# Patient Record
Sex: Male | Born: 1975 | Race: White | Hispanic: No | Marital: Married | Smoking: Never smoker
Health system: Southern US, Community
[De-identification: ages and names within clinical notes are randomized; demographics above are authoritative.]

## PROBLEM LIST (undated history)

## (undated) DIAGNOSIS — G4730 Sleep apnea, unspecified: Secondary | ICD-10-CM

## (undated) DIAGNOSIS — F319 Bipolar disorder, unspecified: Secondary | ICD-10-CM

## (undated) DIAGNOSIS — F329 Major depressive disorder, single episode, unspecified: Secondary | ICD-10-CM

## (undated) DIAGNOSIS — I10 Essential (primary) hypertension: Secondary | ICD-10-CM

## (undated) DIAGNOSIS — F32A Depression, unspecified: Secondary | ICD-10-CM

## (undated) DIAGNOSIS — F419 Anxiety disorder, unspecified: Secondary | ICD-10-CM

## (undated) HISTORY — PX: GASTRIC BYPASS: SHX52

## (undated) HISTORY — PX: CHOLECYSTECTOMY: SHX55

## (undated) HISTORY — PX: KNEE SURGERY: SHX244

## (undated) HISTORY — PX: CORONARY ANGIOPLASTY WITH STENT PLACEMENT: SHX49

## (undated) HISTORY — PX: NOSE SURGERY: SHX723

---

## 2007-12-11 ENCOUNTER — Ambulatory Visit: Payer: Self-pay | Admitting: Family Medicine

## 2007-12-11 LAB — CONVERTED CEMR LAB: Inflenza A Ag: NEGATIVE

## 2008-03-23 ENCOUNTER — Ambulatory Visit: Payer: Self-pay | Admitting: Occupational Medicine

## 2008-03-23 DIAGNOSIS — T148XXA Other injury of unspecified body region, initial encounter: Secondary | ICD-10-CM | POA: Insufficient documentation

## 2008-03-23 DIAGNOSIS — I10 Essential (primary) hypertension: Secondary | ICD-10-CM | POA: Insufficient documentation

## 2012-05-18 ENCOUNTER — Encounter (HOSPITAL_BASED_OUTPATIENT_CLINIC_OR_DEPARTMENT_OTHER): Payer: Self-pay | Admitting: *Deleted

## 2012-05-18 ENCOUNTER — Emergency Department (HOSPITAL_BASED_OUTPATIENT_CLINIC_OR_DEPARTMENT_OTHER): Payer: BC Managed Care – PPO

## 2012-05-18 ENCOUNTER — Emergency Department (HOSPITAL_BASED_OUTPATIENT_CLINIC_OR_DEPARTMENT_OTHER)
Admission: EM | Admit: 2012-05-18 | Discharge: 2012-05-19 | Disposition: A | Discharge status: Home / Self Care | Payer: BC Managed Care – PPO | Attending: Emergency Medicine | Admitting: Emergency Medicine

## 2012-05-18 DIAGNOSIS — I10 Essential (primary) hypertension: Secondary | ICD-10-CM | POA: Insufficient documentation

## 2012-05-18 DIAGNOSIS — Z79899 Other long term (current) drug therapy: Secondary | ICD-10-CM | POA: Insufficient documentation

## 2012-05-18 DIAGNOSIS — Z9884 Bariatric surgery status: Secondary | ICD-10-CM | POA: Insufficient documentation

## 2012-05-18 DIAGNOSIS — R109 Unspecified abdominal pain: Secondary | ICD-10-CM

## 2012-05-18 DIAGNOSIS — R110 Nausea: Secondary | ICD-10-CM | POA: Insufficient documentation

## 2012-05-18 DIAGNOSIS — F319 Bipolar disorder, unspecified: Secondary | ICD-10-CM | POA: Insufficient documentation

## 2012-05-18 DIAGNOSIS — F411 Generalized anxiety disorder: Secondary | ICD-10-CM | POA: Insufficient documentation

## 2012-05-18 DIAGNOSIS — R1032 Left lower quadrant pain: Secondary | ICD-10-CM | POA: Insufficient documentation

## 2012-05-18 DIAGNOSIS — R300 Dysuria: Secondary | ICD-10-CM | POA: Insufficient documentation

## 2012-05-18 HISTORY — DX: Bipolar disorder, unspecified: F31.9

## 2012-05-18 HISTORY — DX: Depression, unspecified: F32.A

## 2012-05-18 HISTORY — DX: Sleep apnea, unspecified: G47.30

## 2012-05-18 HISTORY — DX: Anxiety disorder, unspecified: F41.9

## 2012-05-18 HISTORY — DX: Essential (primary) hypertension: I10

## 2012-05-18 HISTORY — DX: Major depressive disorder, single episode, unspecified: F32.9

## 2012-05-18 LAB — COMPREHENSIVE METABOLIC PANEL
BUN: 12 mg/dL (ref 6–23)
CO2: 27 mEq/L (ref 19–32)
Calcium: 9.4 mg/dL (ref 8.4–10.5)
Creatinine, Ser: 0.9 mg/dL (ref 0.50–1.35)
GFR calc Af Amer: >90 mL/min (ref >90)
GFR calc non Af Amer: >90 mL/min (ref >90)
Glucose, Bld: 126 mg/dL — ABNORMAL HIGH (ref 70–99)

## 2012-05-18 LAB — CBC WITH DIFFERENTIAL/PLATELET
Eosinophils Absolute: 0.3 10*3/uL (ref 0.0–0.7)
Hemoglobin: 15.3 g/dL (ref 13.0–17.0)
MCH: 29.4 pg (ref 26.0–34.0)
MCHC: 33.6 g/dL (ref 30.0–36.0)
Neutro Abs: 9.4 10*3/uL — ABNORMAL HIGH (ref 1.7–7.7)
Neutrophils Relative %: 80 % — ABNORMAL HIGH (ref 43–77)
Platelets: 254 10*3/uL (ref 150–400)

## 2012-05-18 MED ORDER — IOHEXOL 300 MG/ML  SOLN
125.0000 mL | Freq: Once | INTRAMUSCULAR | Status: AC | PRN
  Administered 2012-05-18: 125 mL via INTRAVENOUS

## 2012-05-18 MED ORDER — HYDROMORPHONE HCL PF 1 MG/ML IJ SOLN
1.0000 mg | Freq: Once | INTRAMUSCULAR | Status: AC
  Administered 2012-05-18: 1 mg via INTRAVENOUS
  Filled 2012-05-18: qty 1

## 2012-05-18 MED ORDER — IOHEXOL 300 MG/ML  SOLN
50.0000 mL | Freq: Once | INTRAMUSCULAR | Status: AC | PRN
  Administered 2012-05-18: 50 mL via ORAL

## 2012-05-18 MED ORDER — SODIUM CHLORIDE 0.9 % IV SOLN: Freq: Once | INTRAVENOUS | Status: DC

## 2012-05-18 MED ORDER — ONDANSETRON HCL 4 MG/2ML IJ SOLN
4.0000 mg | Freq: Once | INTRAMUSCULAR | Status: AC
  Administered 2012-05-18: 4 mg via INTRAVENOUS
  Filled 2012-05-18: qty 2

## 2012-05-18 NOTE — ED Notes (Signed)
Pt has hx of Gastric Bypass 12 yrs ago. Presents today with abd distention, dysuria. +nausea. No V/D

## 2012-05-18 NOTE — ED Provider Notes (Signed)
History  This chart was scribed for Jim Booze, MD by Bennett Scrape, ED Scribe. This patient was seen in room MH12/MH12 and the patient's care was started at 7:49 PM.  CSN: [id number]  Arrival date & time 05/18/12  1906   First MD Initiated Contact with Patient 05/18/12 1949      Chief Complaint  Patient presents with  . Abdominal Pain     The history is provided by the patient. No language interpreter was used.    Jim Pearson is a 37 y.o. male with a h/o gastric bypass surgery 12 years ago who presents to the Emergency Department complaining of 9 hours of intermittent episodes of LLQ abdominal pain that radiates to his back with associated nausea, abdominal distention and dysuria. The episodes last approximately 30 minutes long and touch aggravates the pain. He rates his pain a 10 out of 10 at its worse and a 5 out of 10 currently. He states that he has still had flatulence and the pain occasionally feels improved afterwards. He states that he tried milk of magnesia for his symptoms with no improvement. He denies fevers, chills, constipation and diarrhea as associated symptoms. He denies having difficulty vomiting from the bypass surgery. He also has a h/o HTN, bipolar disorder and anxiety. He denies smoking and alcohol use.  PCP is Dr. Verdene Rio  Past Medical History  Diagnosis Date  . Sleep apnea   . Hypertension   . Bipolar 1 disorder   . Depression   . Anxiety     Past Surgical History  Procedure Laterality Date  . Gastric bypass    . Cholecystectomy    . Knee surgery    . Nose surgery      History reviewed. No pertinent family history.  History  Substance Use Topics  . Smoking status: Never Smoker   . Smokeless tobacco: Not on file  . Alcohol Use: No      Review of Systems  Constitutional: Negative for fever and chills.  Gastrointestinal: Positive for nausea, abdominal pain and abdominal distention. Negative for vomiting, diarrhea and constipation.   Genitourinary: Positive for dysuria. Negative for hematuria.  All other systems reviewed and are negative.    Allergies  Review of patient's allergies indicates no known allergies.  Home Medications   Current Outpatient Rx  Name  Route  Sig  Dispense  Refill  . clonazePAM (KLONOPIN) 2 MG tablet   Oral   Take 2 mg by mouth 3 (three) times daily as needed for anxiety.         Marland Kitchen lithium 600 MG capsule   Oral   Take 600 mg by mouth 2 (two) times daily with a meal.         . PRAZOSIN HCL PO   Oral   Take 4 mg by mouth at bedtime.         Marland Kitchen QUEtiapine (SEROQUEL) 400 MG tablet   Oral   Take 800 mg by mouth at bedtime.         Marland Kitchen venlafaxine (EFFEXOR) 100 MG tablet   Oral   Take 100 mg by mouth 3 (three) times daily.         Marland Kitchen zolpidem (AMBIEN) 10 MG tablet   Oral   Take 10 mg by mouth at bedtime as needed for sleep.           Triage Vitals: BP 160/102  Pulse 90  Temp(Src) 98.1 F (36.7 C) (Oral)  Resp 20  Ht  5\' 11"  (1.803 m)  Wt 280 lb (127.007 kg)  BMI 39.07 kg/m2  SpO2 95%  Physical Exam  Nursing note and vitals reviewed. Constitutional: He is oriented to person, place, and time. He appears well-developed and well-nourished. No distress.  Obese   HENT:  Head: Normocephalic and atraumatic.  Eyes: Conjunctivae and EOM are normal.  Neck: Neck supple. No tracheal deviation present.  Cardiovascular: Normal rate and regular rhythm.   Pulmonary/Chest: Effort normal and breath sounds normal. No respiratory distress.  Abdominal: Soft. There is tenderness (diffuse tenderness, worse in RUQ ). There is no rebound and no guarding.  Mildly distended, bowel sounds are decreased   Musculoskeletal: Normal range of motion. He exhibits no edema.  Neurological: He is alert and oriented to person, place, and time.  Skin: Skin is warm and dry.  Psychiatric: He has a normal mood and affect. His behavior is normal.    ED Course  Procedures (including critical care  time)  Medications  0.9 %  sodium chloride infusion (not administered)  HYDROmorphone (DILAUDID) injection 1 mg (not administered)  ondansetron (ZOFRAN) injection 4 mg (not administered)    DIAGNOSTIC STUDIES: Oxygen Saturation is 95% on RA, normal by my interpretation.    COORDINATION OF CARE: 8:13 PM-Discussed treatment plan which includes medications, CT of abdomen, CBC panel, CMP, UA with pt at bedside and pt agreed to plan.   Results for orders placed during the hospital encounter of 05/18/12  CBC WITH DIFFERENTIAL      Result Value Range   WBC 11.8 (*) 4.0 - 10.5 K/uL   RBC 5.20  4.22 - 5.81 MIL/uL   Hemoglobin 15.3  13.0 - 17.0 g/dL   HCT 96.0  45.4 - 09.8 %   MCV 87.5  78.0 - 100.0 fL   MCH 29.4  26.0 - 34.0 pg   MCHC 33.6  30.0 - 36.0 g/dL   RDW 11.9  14.7 - 82.9 %   Platelets 254  150 - 400 K/uL   Neutrophils Relative 80 (*) 43 - 77 %   Neutro Abs 9.4 (*) 1.7 - 7.7 K/uL   Lymphocytes Relative 8 (*) 12 - 46 %   Lymphs Abs 1.0  0.7 - 4.0 K/uL   Monocytes Relative 10  3 - 12 %   Monocytes Absolute 1.1 (*) 0.1 - 1.0 K/uL   Eosinophils Relative 2  0 - 5 %   Eosinophils Absolute 0.3  0.0 - 0.7 K/uL   Basophils Relative 0  0 - 1 %   Basophils Absolute 0.0  0.0 - 0.1 K/uL  COMPREHENSIVE METABOLIC PANEL      Result Value Range   Sodium 139  135 - 145 mEq/L   Potassium 3.7  3.5 - 5.1 mEq/L   Chloride 101  96 - 112 mEq/L   CO2 27  19 - 32 mEq/L   Glucose, Bld 126 (*) 70 - 99 mg/dL   BUN 12  6 - 23 mg/dL   Creatinine, Ser 5.62  0.50 - 1.35 mg/dL   Calcium 9.4  8.4 - 13.0 mg/dL   Total Protein 7.1  6.0 - 8.3 g/dL   Albumin 4.2  3.5 - 5.2 g/dL   AST 26  0 - 37 U/L   ALT 40  0 - 53 U/L   Alkaline Phosphatase 59  39 - 117 U/L   Total Bilirubin 0.5  0.3 - 1.2 mg/dL   GFR calc non Af Amer >90  >90 mL/min   GFR  calc Af Amer >90  >90 mL/min  LIPASE, BLOOD      Result Value Range   Lipase 54  11 - 59 U/L   Ct Abdomen Pelvis W Contrast  05/18/2012  *RADIOLOGY REPORT*   Clinical Data: Diffuse abdominal pain and bloating, nausea, constipation, increased gas, past history cholecystectomy gastric bypass surgery in 2002  CT ABDOMEN AND PELVIS WITH CONTRAST  Technique:  Multidetector CT imaging of the abdomen and pelvis was performed following the standard protocol during bolus administration of intravenous contrast. Sagittal and coronal MPR images reconstructed from axial data set.  Contrast: 50mL OMNIPAQUE IOHEXOL 300 MG/ML  SOLN, OMNIPAQUE IOHEXOL 300 MG/ML  SOLN  Comparison: None  Findings: Lung bases clear. Liver, spleen, pancreas, kidneys, and adrenal glands normal appearance. Normal appendix, bladder, and ureters. Prior gastric bypass surgery. Gaseous distention of colon with tapering normal caliber at distal sigmoid colon as it transits posteriorly in the pelvis. No definite evidence of bowel obstruction or bowel wall thickening.  No free intraperitoneal air. Supraumbilical and periumbilical hernias containing fat. No mass, adenopathy, free fluid inflammatory process. Dilated left inguinal canal containing fat. No acute osseous findings.  IMPRESSION: Diffuse gaseous distention of colon without evidence of obstruction or wall thickening. Ventral and left inguinal hernias containing fat. Prior gastric bypass surgery. No other intra abdominal or intrapelvic abnormalities identified.   Original Report Authenticated By: Ulyses Southward, M.D.     Images viewed by me.  1. Abdominal pain       MDM  Abdominal pain of uncertain cause. Tenderness is slightly worse on the right side of the abdomen. CT is ordered to rule out appendicitis or other serious pathology.  CT shows gaseous distention of the large colon another to a critical extent. WBC is mildly elevated. Patient states that he feels marginally better. On exam, her abdomen continues to be benign. It is soft with only mild tenderness and no rebound or guarding. Patient is reassured of the results of CT scan. He is  asked whether he should take a laxative but I suggested that perhaps a fleets enema would be more effective to get the has started to pass. He is advised to return should symptoms worsen.  I personally performed the services described in this documentation, which was scribed in my presence. The recorded information has been reviewed and is accurate.         Jim Booze, MD 05/19/12 [phone]

## 2012-05-19 NOTE — ED Notes (Signed)
Urine specimen never obtained EDP aware no new  Orders obtained

## 2014-01-13 IMAGING — CT CT ABD-PELV W/ CM
2 of 4 series · 17 of 46 positions shown, 19 images · IV contrast (APPLIED)
Comparison: None

CLINICAL DATA: Diffuse abdominal pain and bloating, nausea,
constipation, increased gas, past history cholecystectomy gastric
bypass surgery in 3443

CT ABDOMEN AND PELVIS WITH CONTRAST
TECHNIQUE: Multidetector CT imaging of the abdomen and pelvis was
performed following the standard protocol during bolus
administration of intravenous contrast. Sagittal and coronal MPR
images reconstructed from axial data set.
Contrast: 50mL OMNIPAQUE IOHEXOL 300 MG/ML  SOLN, 125mL OMNIPAQUE
IOHEXOL 300 MG/ML  SOLN

[Series 2: abd/pelvis 5.0 b31f · axial · 0.97mm/px · z∈[-544,-54]mm · 14 of 108 slices shown, 16 images]
[im 5/108  soft-tissue]
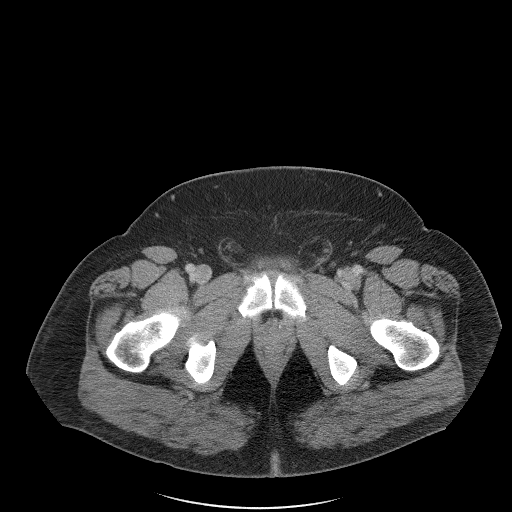
[im 5/108  bone]
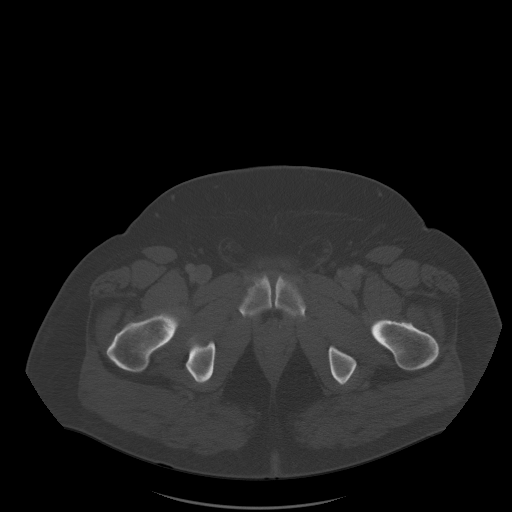
[im 14/108  soft-tissue]
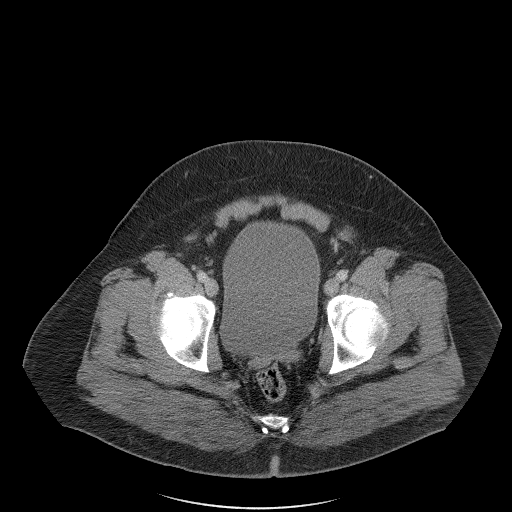
[im 23/108  soft-tissue]
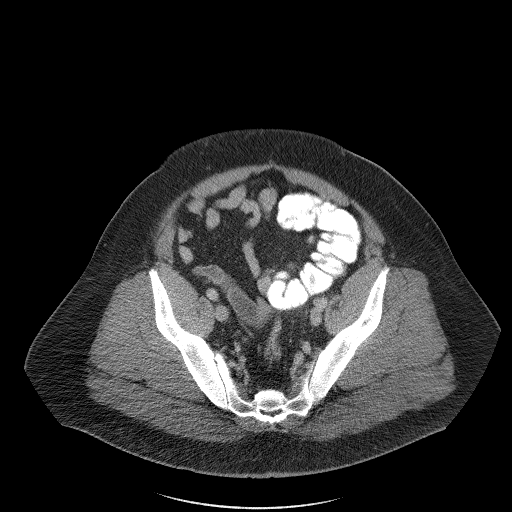
[im 27/108  soft-tissue]
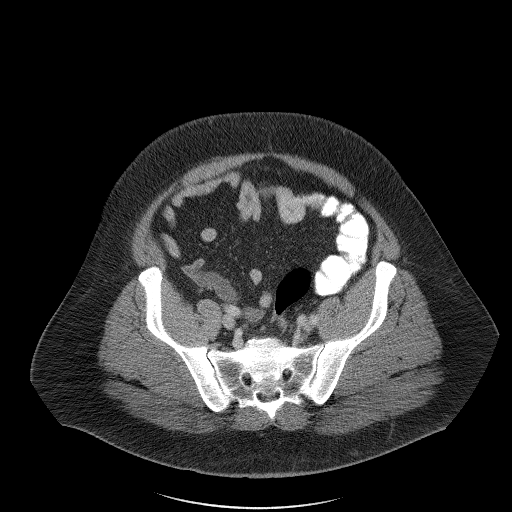
[im 36/108  soft-tissue]
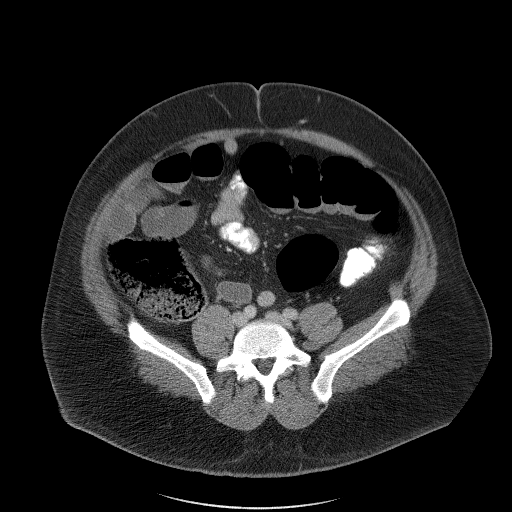
[im 45/108  soft-tissue]
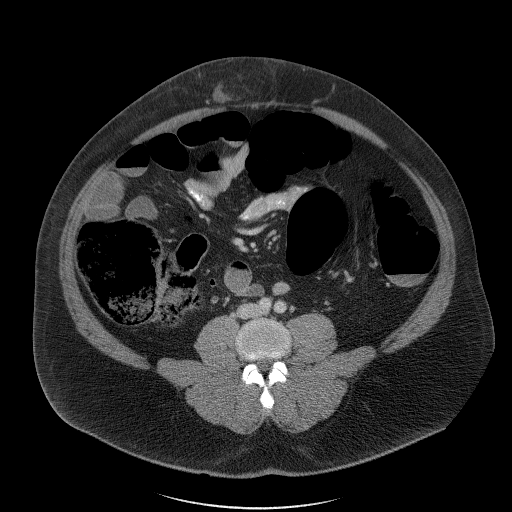
[im 50/108  soft-tissue]
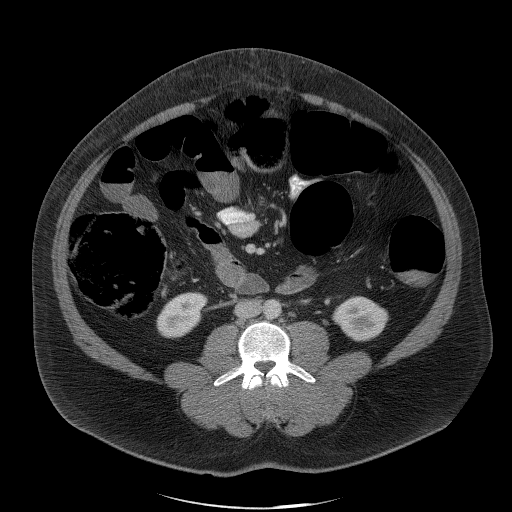
[im 58/108  soft-tissue]
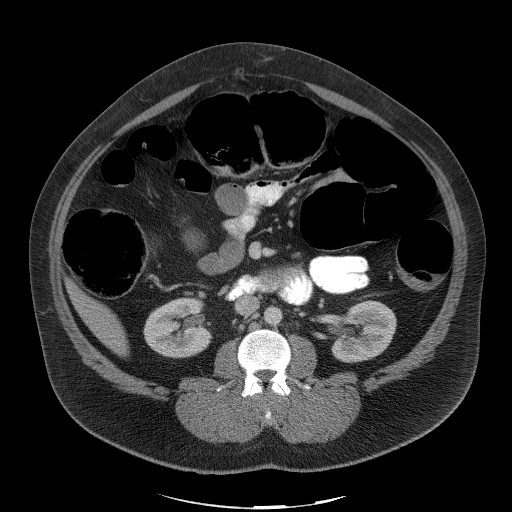
[im 63/108  soft-tissue]
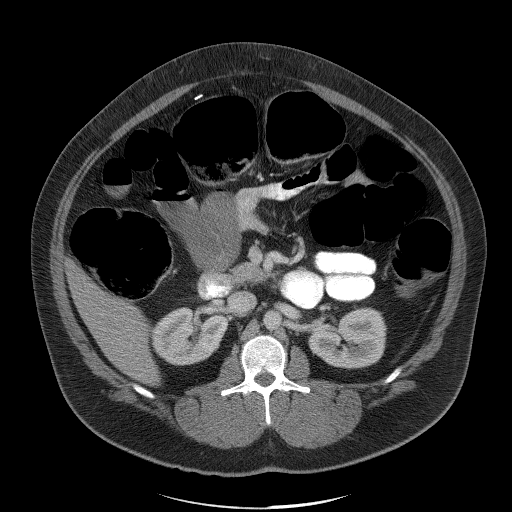
[im 63/108  bone]
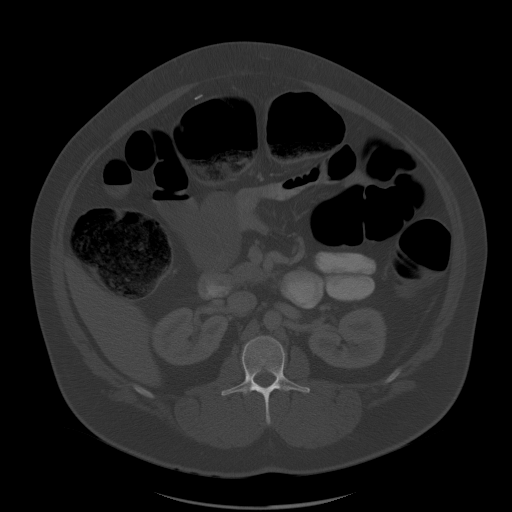
[im 72/108  soft-tissue]
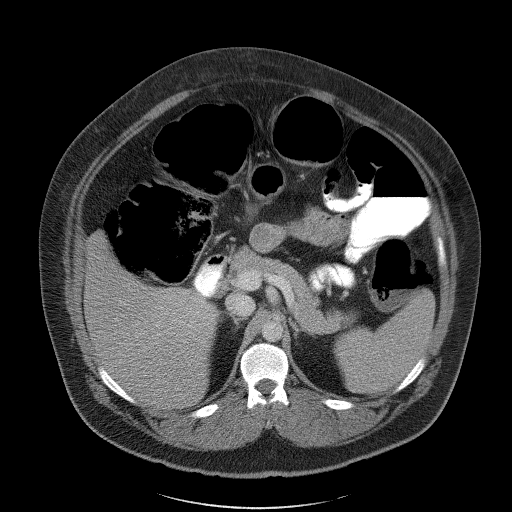
[im 81/108  soft-tissue]
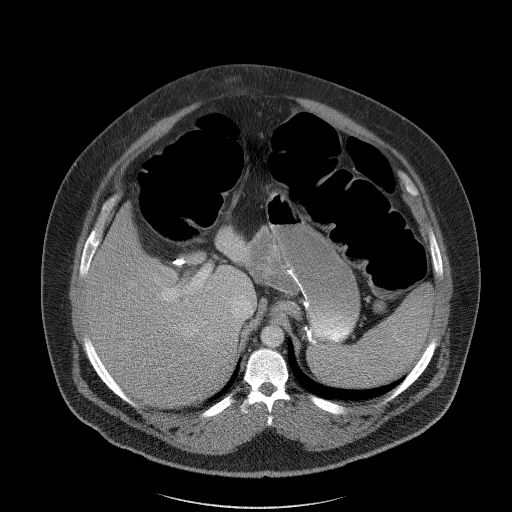
[im 85/108  soft-tissue]
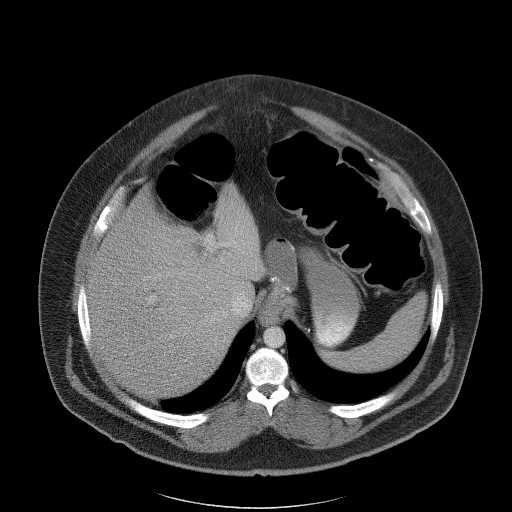
[im 94/108  soft-tissue]
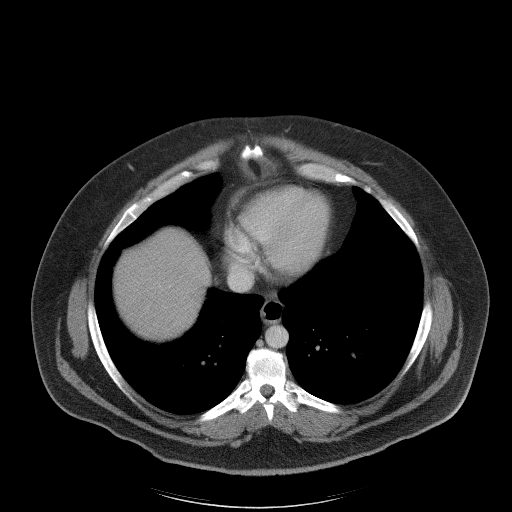
[im 103/108  soft-tissue]
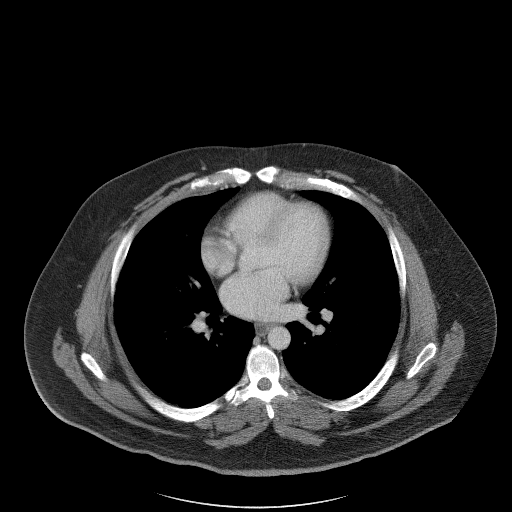

[Series 5: abd/pelvis 3.0 coronal · coronal · 1.00mm/px · 3 of 108 slices shown]
[im 36/108  soft-tissue]
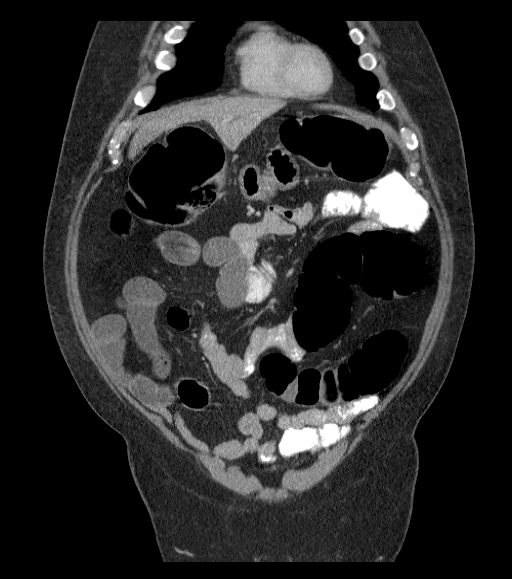
[im 48/108  soft-tissue]
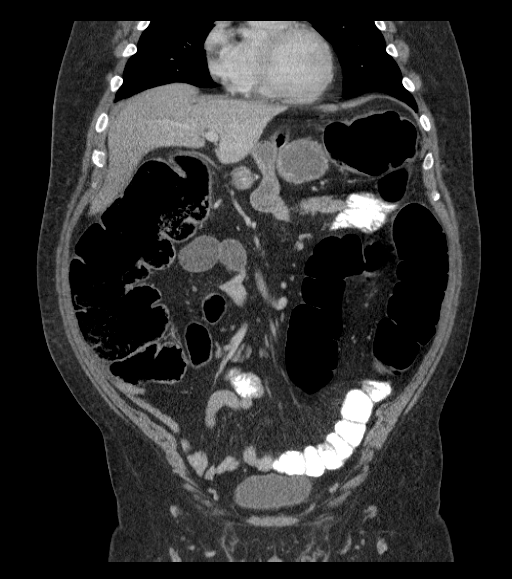
[im 60/108  soft-tissue]
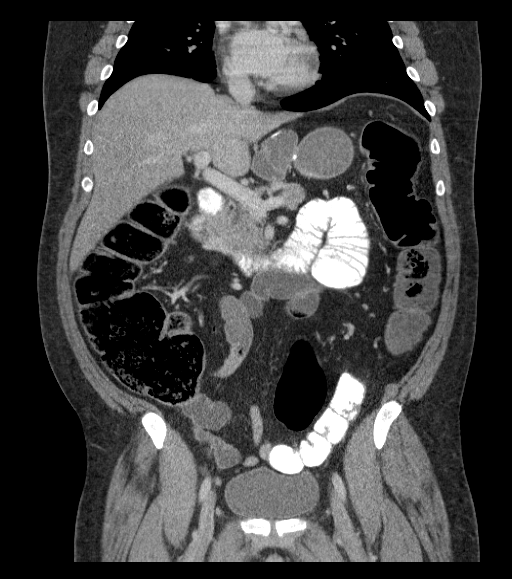

[17 of 46 positions shown; findings below may reference images not displayed]

FINDINGS: Lung bases clear.
Liver, spleen, pancreas, kidneys, and adrenal glands normal
appearance.
Normal appendix, bladder, and ureters. Prior gastric bypass
surgery.
Gaseous distention of colon with tapering normal caliber at distal
sigmoid colon as it transits posteriorly in the pelvis.
No definite evidence of bowel obstruction or bowel wall thickening.

No free intraperitoneal air.
Supraumbilical and periumbilical hernias containing fat.
No mass, adenopathy, free fluid inflammatory process.
Dilated left inguinal canal containing fat.
No acute osseous findings.
IMPRESSION: Diffuse gaseous distention of colon without evidence of obstruction
or wall thickening.
Ventral and left inguinal hernias containing fat.
Prior gastric bypass surgery.
No other intra abdominal or intrapelvic abnormalities identified.

## 2017-05-05 ENCOUNTER — Other Ambulatory Visit: Payer: Self-pay

## 2017-05-05 ENCOUNTER — Encounter (HOSPITAL_BASED_OUTPATIENT_CLINIC_OR_DEPARTMENT_OTHER): Payer: Self-pay | Admitting: *Deleted

## 2017-05-05 ENCOUNTER — Emergency Department (HOSPITAL_BASED_OUTPATIENT_CLINIC_OR_DEPARTMENT_OTHER): Payer: BLUE CROSS/BLUE SHIELD

## 2017-05-05 DIAGNOSIS — I10 Essential (primary) hypertension: Secondary | ICD-10-CM | POA: Insufficient documentation

## 2017-05-05 DIAGNOSIS — Z9884 Bariatric surgery status: Secondary | ICD-10-CM | POA: Insufficient documentation

## 2017-05-05 DIAGNOSIS — Y929 Unspecified place or not applicable: Secondary | ICD-10-CM | POA: Diagnosis not present

## 2017-05-05 DIAGNOSIS — Z9049 Acquired absence of other specified parts of digestive tract: Secondary | ICD-10-CM | POA: Diagnosis not present

## 2017-05-05 DIAGNOSIS — F419 Anxiety disorder, unspecified: Secondary | ICD-10-CM | POA: Insufficient documentation

## 2017-05-05 DIAGNOSIS — W269XXA Contact with unspecified sharp object(s), initial encounter: Secondary | ICD-10-CM | POA: Insufficient documentation

## 2017-05-05 DIAGNOSIS — Y9389 Activity, other specified: Secondary | ICD-10-CM | POA: Insufficient documentation

## 2017-05-05 DIAGNOSIS — F319 Bipolar disorder, unspecified: Secondary | ICD-10-CM | POA: Diagnosis not present

## 2017-05-05 DIAGNOSIS — Y998 Other external cause status: Secondary | ICD-10-CM | POA: Insufficient documentation

## 2017-05-05 DIAGNOSIS — S61314A Laceration without foreign body of right ring finger with damage to nail, initial encounter: Secondary | ICD-10-CM | POA: Insufficient documentation

## 2017-05-05 DIAGNOSIS — Z23 Encounter for immunization: Secondary | ICD-10-CM | POA: Insufficient documentation

## 2017-05-05 NOTE — ED Triage Notes (Signed)
Pt reports he was "collapsing" his gas grill and his right finger was cut through nail bed. Pt has bandage in place from home with no bleeding noted

## 2017-05-06 ENCOUNTER — Emergency Department (HOSPITAL_BASED_OUTPATIENT_CLINIC_OR_DEPARTMENT_OTHER)
Admission: EM | Admit: 2017-05-06 | Discharge: 2017-05-06 | Disposition: A | Discharge status: Home / Self Care | Payer: BLUE CROSS/BLUE SHIELD | Attending: Emergency Medicine | Admitting: Emergency Medicine

## 2017-05-06 DIAGNOSIS — S6991XA Unspecified injury of right wrist, hand and finger(s), initial encounter: Secondary | ICD-10-CM

## 2017-05-06 MED ORDER — CEPHALEXIN 500 MG PO CAPS: 500.0000 mg | ORAL_CAPSULE | Freq: Four times a day (QID) | ORAL | 0 refills | Status: DC

## 2017-05-06 MED ORDER — TETANUS-DIPHTH-ACELL PERTUSSIS 5-2.5-18.5 LF-MCG/0.5 IM SUSP
0.5000 mL | Freq: Once | INTRAMUSCULAR | Status: AC
  Administered 2017-05-06: 0.5 mL via INTRAMUSCULAR
  Filled 2017-05-06: qty 0.5

## 2017-05-06 MED ORDER — CEPHALEXIN 250 MG PO CAPS
500.0000 mg | ORAL_CAPSULE | Freq: Once | ORAL | Status: AC
  Administered 2017-05-06: 500 mg via ORAL
  Filled 2017-05-06: qty 2

## 2017-05-06 MED ORDER — OXYCODONE-ACETAMINOPHEN 5-325 MG PO TABS: 2.0000 | ORAL_TABLET | ORAL | 0 refills | Status: AC | PRN

## 2017-05-06 MED ORDER — OXYCODONE-ACETAMINOPHEN 5-325 MG PO TABS
2.0000 | ORAL_TABLET | Freq: Once | ORAL | Status: AC
  Administered 2017-05-06: 2 via ORAL
  Filled 2017-05-06: qty 2

## 2017-05-06 NOTE — ED Notes (Signed)
ED Provider at bedside. 

## 2017-05-06 NOTE — ED Provider Notes (Signed)
Emergency Department Provider Note   I have reviewed the triage vital signs and the nursing notes.   HISTORY  Chief Complaint Laceration   HPI Jim Pearson is a 42 y.o. male who suffered a laceration to his right ring finger nail and nailbed while trying to fold up a grill couple hours prior to arrival.  Unknown last tetanus.  No previous treatment.  Pain is moderate to severe depending on what he is doing is made worse by movement and touching better with rest. No other associated or modifying symptoms.    Past Medical History:  Diagnosis Date  . Anxiety   . Bipolar 1 disorder (HCC)   . Depression   . Hypertension   . Sleep apnea     Patient Active Problem List   Diagnosis Date Noted  . HYPERTENSION 03/23/2008  . MUSCLE STRAIN 03/23/2008    Past Surgical History:  Procedure Laterality Date  . CHOLECYSTECTOMY    . GASTRIC BYPASS    . KNEE SURGERY    . NOSE SURGERY      Current Outpatient Rx  . Order #: [id number] Class: Print  . Order #: [id number] Class: Historical Med  . Order #: [id number] Class: Historical Med  . Order #: [id number] Class: Print  . Order #: [id number] Class: Historical Med  . Order #: [id number] Class: Historical Med  . Order #: [id number] Class: Historical Med  . Order #: [id number] Class: Historical Med    Allergies Tape  No family history on file.  Social History Social History   Tobacco Use  . Smoking status: Never Smoker  . Smokeless tobacco: Never Used  Substance Use Topics  . Alcohol use: Yes    Comment: weekly  . Drug use: No    Review of Systems  All other systems negative except as documented in the HPI. All pertinent positives and negatives as reviewed in the HPI. ____________________________________________   PHYSICAL EXAM:  VITAL SIGNS: ED Triage Vitals  Enc Vitals Group     BP 05/05/17 2239 (!) 171/104     Pulse Rate 05/05/17 2239 93     Resp 05/05/17 2239 18     Temp 05/05/17 2239 98.6 F (37 C)     Temp Source  05/05/17 2239 Oral     SpO2 05/05/17 2239 95 %    Constitutional: Alert and oriented. Well appearing and in no acute distress. Eyes: Conjunctivae are normal. PERRL. EOMI. Head: Atraumatic. Nose: No congestion/rhinnorhea. Mouth/Throat: Mucous membranes are moist.  Oropharynx non-erythematous. Neck: No stridor.  No meningeal signs.   Cardiovascular: Normal rate, regular rhythm. Good peripheral circulation. Grossly normal heart sounds.   Respiratory: Normal respiratory effort.  No retractions. Lungs CTAB. Gastrointestinal: Soft and nontender. No distention.  Musculoskeletal: No lower extremity tenderness nor edema. No gross deformities of extremities. Neurologic:  Normal speech and language. No gross focal neurologic deficits are appreciated.  Skin:  Skin is warm, dry and laceration over mid nail on right ring finger, well approximated, hemostatic. No rash noted.  ____________________________________________    RADIOLOGY  Dg Finger Ring Right  Result Date: 05/05/2017 CLINICAL DATA:  Injury to the right ring finger. Displaced nail and bleeding 1 hour ago. EXAM: RIGHT RING FINGER 2+V COMPARISON:  None. FINDINGS: There is a comminuted crush fracture to the distal phalangeal tuft of the right fourth finger. Soft tissue swelling is present. If there is nail involvement clinically, this could represent an open fracture. Proximal and middle phalanges appear intact. No dislocation at the interphalangeal joints. IMPRESSION: Comminuted crush fractures to  the distal phalangeal tuft of the right fourth finger. Electronically Signed   By: Burman NievesWilliam  Stevens M.D.   On: 05/05/2017 23:10   ____________________________________________   PROCEDURES  Procedure(s) performed:   Marland Kitchen.Marland Kitchen.Laceration Repair Date/Time: 05/06/2017 4:20 AM Performed by: Marily MemosMesner, Glynn Yepes, MD Authorized by: Marily MemosMesner, Moorea Boissonneault, MD   Consent:    Consent obtained:  Verbal   Consent given by:  Patient   Risks discussed:  Infection, need for  additional repair and poor cosmetic result   Alternatives discussed:  No treatment Anesthesia (see MAR for exact dosages):    Anesthesia method:  None Laceration details:    Location:  Finger   Finger location:  R ring finger   Length (cm):  2 Repair type:    Repair type:  Simple Treatment:    Area cleansed with:  Saline   Amount of cleaning:  Standard   Irrigation solution:  Sterile saline   Irrigation method:  Syringe Skin repair:    Repair method:  Tissue adhesive Approximation:    Approximation:  Close Post-procedure details:    Dressing:  Bulky dressing   Patient tolerance of procedure:  Tolerated well, no immediate complications   ____________________________________________   INITIAL IMPRESSION / ASSESSMENT AND PLAN / ED COURSE  Patient with a crush injury and likely nailbed injury of his right ring finger with a broken nail as well.  It is well approximated and the nail is intact besides the single laceration through it.  Consider removing the nail and doing a nailbed repair however felt that the nail was doing a good job of splinting it in place how it was and glued it together instead.  Will follow up with hand surgery in a week for reevaluation of the tuft fracture.  Start antibiotics for the concern of open fracture.  Gust the possibility of poor cosmetic result patient questions answered and stable for discharge.    Pertinent labs & imaging results that were available during my care of the patient were reviewed by me and considered in my medical decision making (see chart for details).  ____________________________________________  FINAL CLINICAL IMPRESSION(S) / ED DIAGNOSES  Final diagnoses:  Injury of nail bed of finger of right hand, initial encounter     MEDICATIONS GIVEN DURING THIS VISIT:  Medications  oxyCODONE-acetaminophen (PERCOCET/ROXICET) 5-325 MG per tablet 2 tablet (2 tablets Oral Given 05/06/17 0132)  Tdap (BOOSTRIX) injection 0.5 mL (0.5 mLs  Intramuscular Given 05/06/17 0134)  cephALEXin (KEFLEX) capsule 500 mg (500 mg Oral Given 05/06/17 0304)     NEW OUTPATIENT MEDICATIONS STARTED DURING THIS VISIT:  Discharge Medication List as of 05/06/2017  2:56 AM    START taking these medications   Details  cephALEXin (KEFLEX) 500 MG capsule Take 1 capsule (500 mg total) by mouth 4 (four) times daily., Starting Sun 05/06/2017, Print    oxyCODONE-acetaminophen (PERCOCET) 5-325 MG tablet Take 2 tablets by mouth every 4 (four) hours as needed., Starting Sun 05/06/2017, Print        Note:  This note was prepared with assistance of Dragon voice recognition software. Occasional wrong-word or sound-a-like substitutions may have occurred due to the inherent limitations of voice recognition software.   Shivali Quackenbush, Barbara CowerJason, MD 05/06/17 [phone]

## 2017-05-06 NOTE — ED Notes (Signed)
Pt given d/c instructions as per chart. Rx x 2 with precautions. Verbalizes understanding. No questions. 

## 2017-05-06 NOTE — ED Notes (Signed)
Right finger nail is cut into half.  Site is cleansed with NS.

## 2018-12-31 IMAGING — DX DG FINGER RING 2+V*R*
3 series · 3 of 3 positions shown · non-contrast
Comparison: None.

CLINICAL DATA: Injury to the right ring finger. Displaced nail and
bleeding 1 hour ago.

EXAM:
RIGHT RING FINGER 2+V

[finger ap]
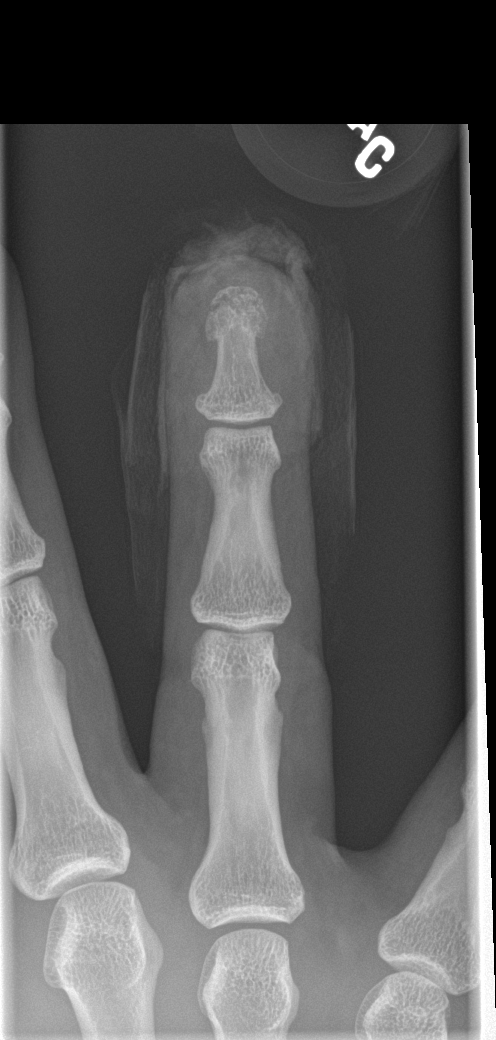

[finger obl]
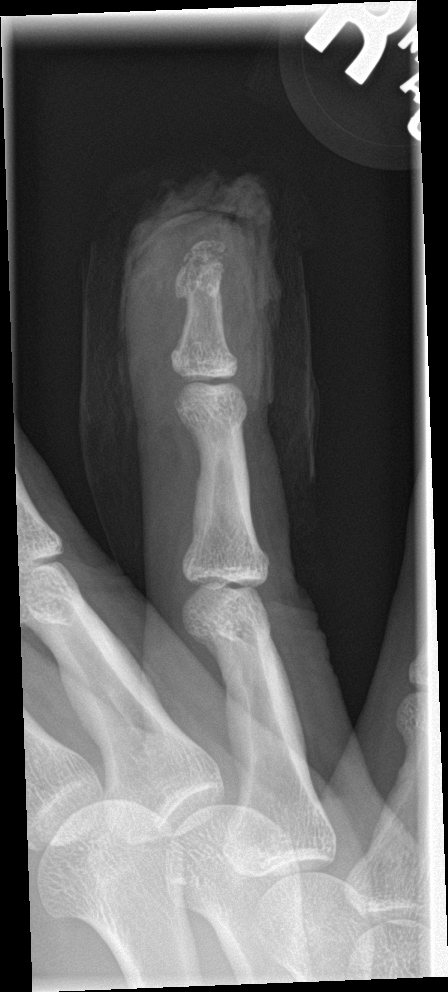

[finger lat]
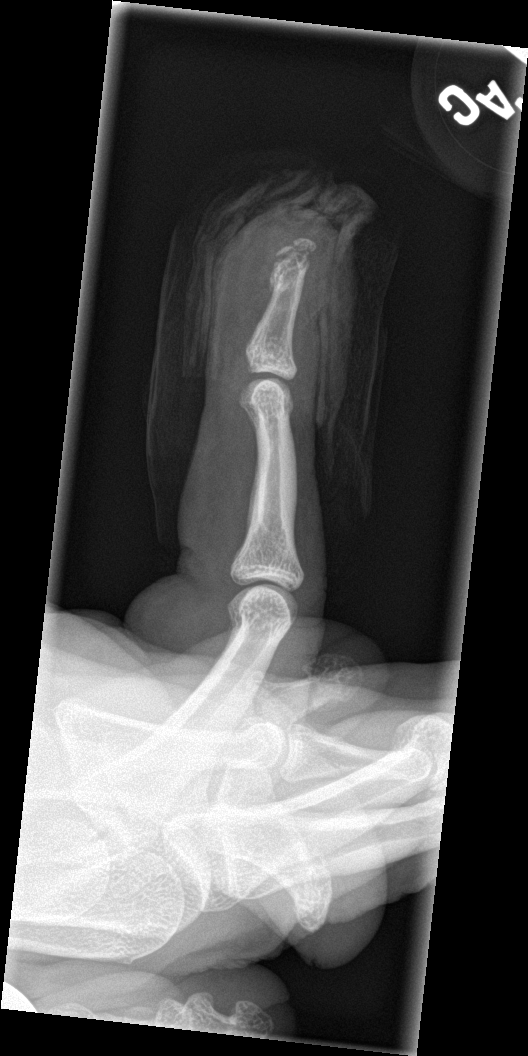

[3 of 3 positions shown; findings below may reference images not displayed]

FINDINGS: There is a comminuted crush fracture to the distal phalangeal tuft
of the right fourth finger. Soft tissue swelling is present. If
there is nail involvement clinically, this could represent an open
fracture. Proximal and middle phalanges appear intact. No
dislocation at the interphalangeal joints.
IMPRESSION: Comminuted crush fractures to the distal phalangeal tuft of the
right fourth finger.

## 2023-06-04 NOTE — ED Provider Notes (Signed)
 [facility]  ED Provider Note  Jim Pearson 48 y.o. male DOB: November 07, 1975 MRN: [id number] History   Chief Complaint  Patient presents with  . Fall    C/o fall last pm with LOC , pt reports confusion today , pt is on plavix    Jim Pearson presents to the emergency department with confusion.  Jim Pearson remembers slipping on a dryer sheet but does not remember what happens.  Jim Pearson says that Jim Pearson does not know if Jim Pearson hit his head.  Somehow, Jim Pearson went to bed, got up, and went to work as usual.  Jim Pearson did things at work that Jim Pearson does not quite remember.  Coworkers said Jim Pearson was a little off balance and seemed a little sleepy.  However, they did not notice any marked difference in his actions.  Jim Pearson is complaining of a mild headache and some throbbing in his ears.  Jim Pearson is not usually 1 to get headaches.  Jim Pearson denies any visual changes.  Jim Pearson had difficulty with fluency earlier today but has no other speech deficits.  No numbness, tingling, or weakness.  Jim Pearson did have 1 prior concussion when Jim Pearson fell and hit his head in the shower.  Jim Pearson has also felt quite fatigued.  Otherwise, Jim Pearson is denying any other complaints.  Jim Pearson is on Plavix for history of multiple coronary artery stents.   History provided by:  Patient Fall       Past Medical History:  Diagnosis Date  . Chronic diarrhea   . Constipation   . Hypertension    NO TREATMENT PER PT. 04-15-13  . Insomnia   . NSTEMI (non-ST elevated myocardial infarction) (*)   . Unspecified nonpsychotic mental disorder    just taking for sleep-  . Vitamin D deficiency     Past Surgical History:  Procedure Laterality Date  . Cholecystectomy    . Gastric bypass    . Hernia repair    . Knee surgery Left    3 total knee surgeries  . Nasal polyp surgery    . Other surgical history     History of Foot Surgery - left foot after lawnmover accident - Dr. Tomberlin  . Other surgical history     History of Knee Surgery - left  . Other surgical  history     History of Uvuloplasty - for sleep apnea  . Other surgical history     History of Cholecystectomy  . Other surgical history     History of Gastric Surgery For Morbid Obesity - 04/2000  . Other surgical history     History of Complete Colonoscopy - await path, Dr. Shana  . Tonsillectomy and adenoidectomy    . Uvulectomy      Social History   Substance and Sexual Activity  Alcohol Use Not Currently   Comment: rarely   Social History   Tobacco Use  Smoking Status Never  Smokeless Tobacco Never   E-Cigarettes  . Vaping Use Never User   . Start Date    . Cartridges/Day    . Quit Date     Social History   Substance and Sexual Activity  Drug Use No         Allergies  Allergen Reactions  . Adhesive [Contact Dermatitis] Rash    Pt states has only occurred from adhesive in lidocaine patch    Home Medications   ACETAMINOPHEN  (TYLENOL ) 500 MG TABLET    Take one tablet (500 mg dose) by mouth every  6 (six) hours as needed for Pain.   ASPIRIN (ASPIRIN LOW DOSE) 81 MG CHEWABLE TABLET    Chew one tablet (81 mg dose) by mouth daily.   ATORVASTATIN (LIPITOR) 80 MG TABLET    Take one tablet (80 mg dose) by mouth at bedtime.   CLOPIDOGREL BISULFATE (PLAVIX) 75 MG TABLET    TAKE 1 TABLET BY MOUTH EVERY DAY   CONTINUOUS BLOOD GLUC RECEIVER (DEXCOM G7 RECEIVER) DEVI    USE AS DIRECTED FOR CONTINUOUS GLUCOSE MONITORING   DEXAMETHASONE (DECADRON) 4 MG TABLET    Take 3 tablets once daily x 2 days then take 2 tablets once daily x 3 days then take 1 tablet once daily x 2 days.  Take on a full stomach.   DICLOFENAC SODIUM (VOLTAREN) 75 MG EC TABLET    After completion of dexamethasone, take 1 tablet every 12 hours on a full stomach as needed for elbow pain.   ETHOSUXIMIDE (ZARONTIN) 250 MG CAPSULE    Take two capsules (500 mg dose) by mouth 2 (two) times daily.   EZETIMIBE (ZETIA) 10 MG TABLET    Take one tablet (10 mg dose) by mouth at bedtime.   EZETIMIBE (ZETIA) 10 MG TABLET     Take one tablet (10 mg dose) by mouth at bedtime.   METOPROLOL SUCCINATE (TOPROL-XL) 100 MG 24 HR TABLET    TAKE 1 TABLET BY MOUTH EVERY DAY   NIFEDIPINE (NIFEDICAL XL,ADALAT CC, PROCARDIA XL) 30 MG 24 HR TABLET    Take one tablet (30 mg dose) by mouth daily.   NITROGLYCERIN (NITROSTAT) 0.4 MG SL TABLET    DISSOLVE 1 TAB UNDER TONGUE FOR CHEST PAIN - IF PAIN REMAINS AFTER 5 MIN, CALL 911 AND REPEAT DOSE. MAX 3 TABS IN 15 MINUTES   PANTOPRAZOLE SODIUM (PROTONIX) 40 MG TABLET    Take one tablet (40 mg dose) by mouth 2 (two) times daily.   QUETIAPINE (SEROQUEL) 400 MG TABLET    SMARTSIG:1 Tablet(s) By Mouth Every Evening   QUETIAPINE FUMARATE (SEROQUEL) 100 MG TABLET    Take one tablet (100 mg dose) by mouth at bedtime as needed. May take with a Seroquel 200mg  tablet as needed   QUETIAPINE FUMARATE (SEROQUEL) 200 MG TABLET    Take one tablet (200 mg dose) by mouth at bedtime as needed. May take with a Seroquel 100mg  tablet as needed   RANOLAZINE (RANEXA) 1000 MG SR TABLET    Take one tablet (1,000 mg dose) by mouth 2 (two) times daily.   ZOLPIDEM TARTRATE (AMBIEN) 10 MG TABLET    Take one tablet (10 mg dose) by mouth at bedtime.    Primary Survey  Primary Survey  Review of Systems   Review of Systems  Physical Exam   ED Triage Vitals [06/04/23 1654]  BP (!) 168/104  Heart Rate 51  Resp 16  SpO2 100 %  Temp 98.5 F (36.9 C)    Physical Exam  Nursing note and vitals reviewed. Constitutional: Jim Pearson appears well-developed and well-nourished. Jim Pearson no respiratory distress.  HENT:  Head: Normocephalic and atraumatic.  Eyes: Conjunctivae are normal. Right eye: no drainage. no conjunctival injection. Left eye: no drainage. no conjunctival injection.  Pulmonary/Chest: No respiratory distress. Respiratory effort normal.  Musculoskeletal: No obvious deformity noted to extremities. no edema.  Neurological: Jim Pearson is alert and oriented to person, place, and time. Moves all extremities equally. Jim Pearson has  normal speech. Cranial nerves intact II through XII. Finger to nose intact bilaterally. Sensation intact to  light touch, bilateral upper and lower extremities. Strength 5/5 bilateral upper and lower extremities.  Skin: Skin is warm. Skin is dry.  Psychiatric: Jim Pearson has a normal mood and affect. His behavior is normal.     ED Course   Lab results:   COMPREHENSIVE METABOLIC PANEL - Normal      Result Value   Na 138     Potassium 4.0     Cl 100     CO2 27     AGAP 11     Glucose 91     BUN 16     Creatinine 0.94     Ca 9.4     ALK PHOS 45     T Bili 0.8     Total Protein 7.0     Alb 4.7     GLOBULIN 2.3     ALBUMIN/GLOBULIN RATIO 2.0     BUN/CREAT RATIO 17.0     ALT 26     AST 26     eGFR 100     Comment: Normal GFR (glomerular filtration rate) > 60 mL/min/1.73 meters squared, < 60 may include impaired kidney function. Calculation based on the Chronic Kidney Disease Epidemiology Collaboration (CK-EPI)equation refit without adjustment for race.  CBC AND DIFFERENTIAL   WBC 4.6     RBC 4.79     HGB 14.4     HCT 42.9     MCV 89.6     MCH 30.1     MCHC 33.6     Plt Ct 211     RDW SD 43.5     MPV 9.8     NRBC% 0.0     Absolute NRBC Count 0.00     NEUTROPHIL % 51.1     LYMPHOCYTE % 34.9     MONOCYTE % 11.6     Eosinophil % 2.0     BASOPHIL % 0.2     IG% 0.2     ABSOLUTE NEUTROPHIL COUNT 2.34     ABSOLUTE LYMPHOCYTE COUNT 1.60     Absolute Monocyte Count 0.53     Absolute Eosinophil Count 0.09     Absolute Basophil Count 0.01     Absolute Immature Granulocyte Count 0.01      Imaging:   CT HEAD WO CONTRAST   Narrative:    INDICATION:    Trauma Head,  COMPARISON:   03/14/2021  TECHNIQUE:    Multiple axial images obtained from the skull base to the vertex without IV contrast  were obtained on  06/04/2023 5:50 PM  FINDINGS: Brain parenchyma is within normal limits. No evidence of hydrocephalus. No intracranial mass, mass effect or midline shift. No acute  infarction evident. No intracranial hemorrhage. Paranasal sinuses: Clear. Mastoid air cells: Clear. Calvarium: Intact.    Impression:    IMPRESSION: No acute intracranial abnormality.  Electronically Signed by: Charlie Redfern MD on 06/04/2023 6:41 PM  CT SPINE CERVICAL WO CONTRAST   Narrative:    INDICATION: Trauma  COMPARISON: 06/14/2013  TECHNIQUE: CT cervical spine without IV contrast. Coronal and sagittal reconstructions were evaluated. Radiation dose reduction was utilized (automated exposure control, mA or kV adjustment based on patient size, or iterative image reconstruction).  FINDINGS: No fracture or dislocation. Vertebral body heights and intervertebral disc spaces are preserved. Posterior elements are within normal limits.    Impression:    IMPRESSION: 1. No fracture or dislocation.  Electronically Signed by: Charlie Redfern MD on 06/04/2023 6:43 PM      ECG: ECG  Results          ECG 12 lead (In process)  Result time 06/04/23 18:52:09    In process             Narrative:   Diagnosis Class Borderline Abnormal Acquisition Device D3K Ventricular Rate 54 Atrial Rate 54 P-R Interval 148 QRS Duration 102 Q-T Interval 434 QTC Calculation(Bazett) 411 Calculated P Axis 36 Calculated R Axis 20 Calculated T Axis 43  Diagnosis Sinus bradycardia with sinus arrhythmia Otherwise normal ECG When compared with ECG of 14-Mar-2021 16:44, No significant change was found                                        NIH Stroke Scale Score Interval: Baseline Level of Consciousness (1a.): Alert, keenly responsive LOC Questions (1b.): Answers both questions correctly LOC Commands (1c.): Performs both tasks correctly Best Gaze (2.): Normal Visual (3.): No visual loss Facial Palsy (4.): Normal symmetrical movements Motor Arm, Left (5a.): No drift Motor Arm, Right (5b.): No drift Motor Leg, Left (6a.): No drift Motor Leg, Right (6b.): No drift Limb  Ataxia (7.): Absent Sensory (8.): Normal, no sensory loss Best Language (9.): No aphasia Dysarthria (10.): Normal Extinction and Inattention (11.) (Formerly Neglect): No abnormality NIH Total: 0                                            Pre-Sedation Procedures    Medical Decision Making Gavyn Ybarra Ruacho presents to the ED after sustaining a likely head injury.  His memory of the event is foggy, and the details are not entirely known.  However, Jim Pearson states that Jim Pearson feels identical to his previous concussion.  Jim Pearson did slip on a dryer sheet.  Jim Pearson has some minimal symptoms of confusion but a normal neuroexam.  No evidence of intracranial trauma.  I also checked labs because Jim Pearson endorsed some extra fatigue.  Jim Pearson states that Jim Pearson feels a little bit worse than Jim Pearson feels even when Jim Pearson is due for his B12 injection.  Jim Pearson has just had a B12 injection, and it cannot be that.  Labs were reassuring.  Jim Pearson was given instructions regarding the symptomatic management of concussion.  We talked about trying to avoid head injuries and not putting himself in situations where Jim Pearson would need to have a normal reaction time.  Jim Pearson was given instructions regarding follow-up.  Problems Addressed: Closed head injury, initial encounter: acute illness or injury with systemic symptoms Other fatigue: acute illness or injury with systemic symptoms  Amount and/or Complexity of Data Reviewed External Data Reviewed: notes.    Details: Outpatient notes Labs: ordered. Decision-making details documented in ED Course. Radiology: ordered and independent interpretation performed. Decision-making details documented in ED Course.    Details: CT head shows no bleed or midline shift ECG/medicine tests: ordered and independent interpretation performed. Decision-making details documented in ED Course.           Provider Communication  New Prescriptions   No medications on file    Modified Medications   No  medications on file    Discontinued Medications   No medications on file    Clinical Impression Final diagnoses:  Closed head injury, initial encounter  Other fatigue    ED Disposition     ED  Disposition  Discharge   Condition  Stable   Comment  --                 Follow-up Information     Sierra Cherisse Goodness, MD.   Specialties: Obstetrics and Gynecology, Family Medicine Comments: As needed Contact information: [facility] [address] [place] [address] [phone]                  Electronically signed by:    Therisa KANDICE Silvan, MD 06/04/23 [id number]

## 2023-09-21 NOTE — ED Provider Notes (Signed)
 ------------------------------------------------------------------------------- Attestation signed by Therisa KANDICE Silvan, MD at 09/23/23 1048 I attest that I am the attending provider for this patient and have reviewed the medical decision-making and plan as part of ongoing quality measures.  I was physically present in the department and available for consultation.  This case was not discussed with me during the patient's time in the ED.  -------------------------------------------------------------------------------   [facility]  ED Provider Note  Jim Pearson 48 y.o. male DOB: 12/27/1975 MRN: [id number]  Tele-Medical screening initiated and orders placed by DOROTHA JAYSON Simpers, PA. 09/21/2023 / 3:18 PM  48 y.o. male with hx NSTEMI (off ranexa and ASA) presents with pain in the left lower molar for the last 3 to 4 days.  Reports the tooth broke in half several months ago and has not caused problems until now.  Developed sore throat, chest pain and pain across entire lower jaw yesterday. Felt like he was hit by [employer] truck.  Pt restarted Ranexa yesterday and has been taking Tylenol  500 mg and ibuprofen  800 mg twice a day for body aches and pain without relief.   BP 163/100, vitals otherwise normal.  Patient alert, no distress, breathing nonlabored.  Patient seen and received a tele-medical screening examination in triage.  The provider performing the medical screening exam was not located at the facility and was located remotely.  Patient understands that the provider is seeing them remotely and consents to the exam.  Appropriate orders have been initiated based on my brief physical exam and HPI. Patient placed in appropriate area until a treatment room becomes available for further evaluation and management by the in-house provider.  This tele-medical screening exam was electronically signed by DOROTHA JAYSON Simpers, PA on 09/21/2023 at 3:18 PM  History   Chief Complaint   Patient presents with  . Sore Throat    Onset yesterday. Patient denies [facility] or difficulty with swallowing.   . Jaw Pain    Onset three days go. Started on left side but now is both left and right jaw. Patient states he did have half of a molar on the left side fall out about six months ago.   . Chest Pain    Onset about a week ago. Patient has a hx of NSTEMI.    See telemetry triage HPI above.   Patient states he has had intermittent left-sided chest pain for the past several weeks.  Patient says this is not new pain he has a history of angina and this feels similar.  New symptoms today include fatigue.  Patient says it feels like he got hit by a truck.  Patient says he woke up yesterday just felt extremely drained which is very unlike him.  Patient also describes a sore throat bilaterally and a headache across the top of his head..  States it feels like he has the flu.  patient denies other sick symptoms including denies fever, cough, congestion.  Denies nausea, vomiting, diarrhea.        Past Medical History:  Diagnosis Date  . Chronic diarrhea   . Constipation   . Hypertension    NO TREATMENT PER PT. 04-15-13  . Insomnia   . NSTEMI (non-ST elevated myocardial infarction) (*)   . Unspecified nonpsychotic mental disorder    just taking for sleep-  . Vitamin D deficiency     Past Surgical History:  Procedure Laterality Date  . Cholecystectomy    . Gastric bypass    . Hernia repair    .  Knee surgery Left    3 total knee surgeries  . Nasal polyp surgery    . Other surgical history     History of Foot Surgery - left foot after lawnmover accident - Dr. Tomberlin  . Other surgical history     History of Knee Surgery - left  . Other surgical history     History of Uvuloplasty - for sleep apnea  . Other surgical history     History of Cholecystectomy  . Other surgical history     History of Gastric Surgery For Morbid Obesity - 04/2000  . Other surgical history     History  of Complete Colonoscopy - await path, Dr. Shana  . Tonsillectomy and adenoidectomy    . Uvulectomy      Social History   Substance and Sexual Activity  Alcohol Use Not Currently   Comment: rarely   Tobacco Use History[1] E-Cigarettes  . Vaping Use Never User   . Start Date    . Cartridges/Day    . Quit Date     Social History   Substance and Sexual Activity  Drug Use No         Allergies[2]  Discharge Medication List as of 09/21/2023  6:34 PM     CONTINUE these medications which have NOT CHANGED   Details  acetaminophen  (TYLENOL ) 500 mg tablet Take one tablet (500 mg dose) by mouth every 6 (six) hours as needed for Pain., Historical Med    aspirin (ASPIRIN LOW DOSE) 81 mg chewable tablet Chew one tablet (81 mg dose) by mouth daily., Starting Mon 10/24/2021, Normal    atorvastatin (LIPITOR) 80 mg tablet Take one tablet by mouth daily at bedtime., Normal    clopidogrel bisulfate (PLAVIX) 75 mg tablet Take one tablet (75 mg dose) by mouth daily., Starting Thu 08/02/2023, Normal    Continuous Blood Gluc Receiver (DEXCOM G7 RECEIVER) DEVI USE AS DIRECTED FOR CONTINUOUS GLUCOSE MONITORING, Historical Med    dexamethasone (DECADRON) 4 mg tablet Take 3 tablets once daily x 2 days then take 2 tablets once daily x 3 days then take 1 tablet once daily x 2 days.  Take on a full stomach., Normal    diclofenac sodium (VOLTAREN) 75 mg EC tablet After completion of dexamethasone, take 1 tablet every 12 hours on a full stomach as needed for elbow pain., Normal    ethosuximide (ZARONTIN) 250 MG capsule Take two capsules (500 mg dose) by mouth 2 (two) times daily., Starting Tue 08/31/2020, Historical Med    ezetimibe (ZETIA) 10 MG tablet Take one tablet (10 mg total) daily.  An appointment is required in order to have future refills authorized. Call office to avoid interruption in medication., Normal    gabapentin (NEURONTIN) 400 mg capsule Take one capsule (400 mg dose) by mouth daily.,  Starting Thu 05/03/2023, Historical Med    metoprolol succinate (TOPROL-XL) 100 mg 24 hr tablet Take one tablet (100 mg dose) by mouth daily., Starting Thu 08/02/2023, Normal    mirabegron (MYRBETRIQ) 50 mg 24 hr tablet Take one tablet (50 mg dose) by mouth daily., Starting Wed 07/04/2023, Until Sat 06/28/2024, Historical Med    NIFEdipine (NIFEDICAL XL,ADALAT CC, PROCARDIA XL) 30 mg 24 hr tablet Take one tablet (30 mg dose) by mouth daily., Starting Mon 02/21/2021, Historical Med    nitroGLYCERIN (NITROSTAT) 0.4 mg SL tablet DISSOLVE 1 TAB UNDER TONGUE FOR CHEST PAIN - IF PAIN REMAINS AFTER 5 MIN, CALL 911 AND REPEAT DOSE. MAX 3 TABS  IN 15 MINUTES, Normal    pantoprazole sodium (PROTONIX) 40 mg tablet Take one tablet (40 mg dose) by mouth 2 (two) times daily., Starting Tue 01/25/2021, Historical Med    !! QUEtiapine (SEROQUEL) 400 MG tablet SMARTSIG:1 Tablet(s) By Mouth Every Evening, Historical Med    !! QUEtiapine fumarate (SEROQUEL) 200 mg tablet Take one tablet (200 mg dose) by mouth at bedtime as needed. May take with a Seroquel 100mg  tablet as needed, Starting Mon 11/01/2020, Historical Med    ranolazine (RANEXA) 1000 MG SR tablet Take one tablet (1,000 mg dose) by mouth 2 (two) times daily., Starting Mon 10/16/2022, Normal    trimethoprim-polymyxin b (POLYTRIM) ophthalmic solution Place one drop into the left eye every 4 (four) hours for 10 days., Starting Thu 08/23/2023, Until Sun 09/02/2023, Normal    zolpidem tartrate (AMBIEN) 10 mg tablet Take one tablet (10 mg dose) by mouth at bedtime., Historical Med     !! - Potential duplicate medications found. Please discuss with provider.      Primary Survey   Exposure     No visible trauma noted on back exam.      Review of Systems   Review of Systems  Constitutional:  Positive for fatigue.  Cardiovascular:  Positive for chest pain.  Neurological:  Positive for headaches.    Physical Exam   ED Triage Vitals  BP 09/21/23 1448  (!) 163/100  Heart Rate 09/21/23 1448 60  Resp 09/21/23 1448 18  SpO2 09/21/23 1448 98 %  Temp 09/21/23 1448 98.7 F (37.1 C)    Physical Exam  Nursing note and vitals reviewed. Constitutional: He appears well-developed and well-nourished. He does not appear ill.  HENT:  Head: Normocephalic and atraumatic.  Right Ear: Normal external ear.  Left Ear: Normal external ear.  Eyes: EOM are intact. Pupils are equal, round, and reactive to light.  Neck: Normal range of motion.  Cardiovascular: Normal rate and intact distal pulses.  Pulmonary/Chest: Respiratory effort normal.  Abdominal: Soft.  Musculoskeletal: Normal range of motion. No visible trauma noted on back exam.     Cervical back: Normal range of motion.   Neurological: He is alert and oriented to person, place, and time. Moves all extremities equally. Gait normal. He has normal speech.  Skin: Skin is warm. Skin is dry.  Psychiatric: He has a normal mood and affect.     ED Course   Lab results:   CBC AND DIFFERENTIAL - Abnormal      Result Value   WBC 4.1     RBC 4.94     HGB 14.6     HCT 44.2     MCV 89.5     MCH 29.6     MCHC 33.0     Plt Ct 220     RDW SD 44.5     MPV 9.8     NRBC% 0.0     Absolute NRBC Count 0.00     NEUTROPHIL % 64.6     LYMPHOCYTE % 22.7     MONOCYTE % 8.6     Eosinophil % 3.7     BASOPHIL % 0.2     IG% 0.2     ABSOLUTE NEUTROPHIL COUNT 2.62     ABSOLUTE LYMPHOCYTE COUNT 0.92 (*)    Absolute Monocyte Count 0.35     Absolute Eosinophil Count 0.15     Absolute Basophil Count 0.01     Absolute Immature Granulocyte Count 0.01    BETA STREP SCREEN  THROAT - Normal   Strep A Negative    COVID-19, FLU A+B AND RSV - Normal   Flu A Negative     Flu B Negative     RSV PCR Negative     SARS-COV-2 Not Detected     Narrative:    SARS-COV-2 (COVID-19)PCR-Negative results do not preclude SARS-CoV-2 infection and should not be used as the sole basis for patient management decisions. Negative  results must be combined with clinical observations, patient history, and epidemiological information.  Flu and/or RSV - Negative results do not preclude the presence of Flu or RSV virus and should not be used as the sole basis for treatment or other patient management decisions. False negative results may occur if virus is present at levels below the analytical limit of detection.  This test detects Influenza A, Influenza B, and Respiratory Syncytial Virus and SARS-COV-2 (COVID-19) by PCR.     GEN5 CARDIAC TROPONIN T (TNT5) BASELINE - Normal   TnT-Gen5 (0hr) 9     Comment: An elevated Troponin indicates myocardial damage. Elevated troponin may also be due to pulmonary emboli, aortic dissection, heart failure, trauma, toxins and ischemia in the setting of critical illness.   MAGNESIUM - Normal   Mg 2.0    GEN5 CARDIAC TROPONIN T(TNT5) 1 HOUR - Normal   TnT-Gen5 (1hr) 9     Comment: An elevated Troponin indicates myocardial damage. Elevated troponin may also be due to pulmonary emboli, aortic dissection, heart failure, trauma, toxins and ischemia in the setting of critical illness.   Delta 1 Hour 0    TSH - Normal   TSH 0.86    CULTURE, THROAT  LIGHT BLUE TOP  LAVENDER TOP  MINT GREEN-TOP TUBE (PST GEL/LI HEP)  GOLD SST    Imaging:   XR CHEST AP PORTABLE   Narrative:    TECHNIQUE: XR CHEST AP PORTABLE  INDICATION: Chest Pain   COMPARISON: Multiple priors, most recent 02/25/2021  FINDINGS:  Support Devices: None.  Cardiac/Mediastinum: Cardiomediastinal silhouette is within normal limits.   Lungs/Pleura: No focal airspace consolidation. No pleural effusion or pneumothorax.   Additional findings: Unremarkable appearance of the upper abdomen.  No acute displaced fracture.     Impression:    IMPRESSION:  No evidence of acute cardiopulmonary abnormality.   Electronically Signed by: Duwaine Ruth on 09/21/2023 4:11 PM     ECG: ECG Results          ECG 12 lead (Final  result)  Result time 09/21/23 17:44:58    Final result             Narrative:   Diagnosis Class Normal Acquisition Device D3K Ventricular Rate 62 Atrial Rate 62 P-R Interval 152 QRS Duration 100 Q-T Interval 396 QTC Calculation(Bazett) 401 Calculated P Axis 40 Calculated R Axis 8 Calculated T Axis 35  Diagnosis Normal sinus rhythm Normal ECG When compared with ECG of 04-Jun-2023 18:36, No significant change was found Normal axis No acute injury pattern Brien Kung ([id number]) on 09/21/2023 5:44:48 PM certifies that he/she has reviewed the ECG tracing and confirms the independent  interpretation is correct.                            HEAR Score History: Mostly low risk features ECG: Normal Age: 54-64 yrs Risk Factors: More than or equal 3 risk factors or history of atherosclerotic disease HEAR Score Total: 3  Pre-Sedation Procedures    Medical Decision Making History and physical exam were reviewed. Differential includes but is not limited to ACS, angina, PE, thyroid, viral illness, COVID, flu, RSV, strep..  Patient's workup showed no acute abnormalities.  Patient's troponin initial was 9 with a 1 hour troponin of 9 delta 0.  Discontinued ACS ruled out.  TSH was normal.  COVID flu RSV came back negative. Patient 99% oxygen with a heart rate of 64, low suspicion for PE at this time. EKG normal sinus rhythm. X-ray shows no acute abnormalities. Patient discharged stable condition with recommendations to follow-up with primary care as needed.  Recommend increase hydration over the next few days.  My exam, findings, treatment plan has been discussed with the patient.  Patient understands, had the opportunity to ask questions, and agrees with the findings, diagnosis, and treatment plan.   Strict return and follow-up precautions have been given by me personally to the  patient/family/caregiver(s).  Please see the  'After Visit Summary' discharge instructions for additional discharge plan.  *This note was dictated with voice recognition software.  Similar sounding words or phrases can inadvertently be transcribed and may not be corrected upon review.   Amount and/or Complexity of Data Reviewed Labs: ordered. Radiology: ordered. ECG/medicine tests: ordered.  Risk OTC drugs.             Provider Communication  Discharge Medication List as of 09/21/2023  6:34 PM      Discharge Medication List as of 09/21/2023  6:34 PM      Discharge Medication List as of 09/21/2023  6:34 PM      Clinical Impression Final diagnoses:  Chest pain, unspecified type  Fatigue, unspecified type    ED Disposition     ED Disposition  Discharge   Condition  Stable   Comment  --                 Follow-up Information     Sierra Cherisse Goodness, MD. Schedule an appointment as soon as possible for a visit in 2 days.   Specialties: Obstetrics and Gynecology, Family Medicine Comments: follow-up Contact information: [facility] [address] [place] [address] [phone]         [facility]. Schedule an appointment as soon as possible for a visit in 2 days.   Comments: follow-up Contact information: [address] [facility] [address] [place]  [address] [phone]                 Electronically signed by:       [1] Social History Tobacco Use  Smoking Status Never  Smokeless Tobacco Never  [2] Allergies Allergen Reactions  . Adhesive [Contact Dermatitis] Rash    Pt states has only occurred from adhesive in lidocaine patch  . Lidocaine Rash   Fleeta Mardy Givens, PA-C 09/21/23 2153

## 2023-10-08 ENCOUNTER — Other Ambulatory Visit: Payer: Self-pay

## 2023-10-08 ENCOUNTER — Emergency Department (HOSPITAL_BASED_OUTPATIENT_CLINIC_OR_DEPARTMENT_OTHER)
Admission: EM | Admit: 2023-10-08 | Discharge: 2023-10-08 | Disposition: A | Discharge status: Home / Self Care | Attending: Emergency Medicine | Admitting: Emergency Medicine

## 2023-10-08 ENCOUNTER — Encounter (HOSPITAL_BASED_OUTPATIENT_CLINIC_OR_DEPARTMENT_OTHER): Payer: Self-pay

## 2023-10-08 DIAGNOSIS — N3001 Acute cystitis with hematuria: Secondary | ICD-10-CM | POA: Insufficient documentation

## 2023-10-08 DIAGNOSIS — R300 Dysuria: Secondary | ICD-10-CM | POA: Diagnosis present

## 2023-10-08 LAB — CBC WITH DIFFERENTIAL/PLATELET
Abs Immature Granulocytes: 0.04 K/uL (ref 0.00–0.07)
Basophils Absolute: 0 K/uL (ref 0.0–0.1)
Basophils Relative: 0 %
Eosinophils Absolute: 0 K/uL (ref 0.0–0.5)
Eosinophils Relative: 1 %
HCT: 39.8 % (ref 39.0–52.0)
Hemoglobin: 14 g/dL (ref 13.0–17.0)
Immature Granulocytes: 1 %
Lymphocytes Relative: 7 %
Lymphs Abs: 0.6 K/uL — ABNORMAL LOW (ref 0.7–4.0)
MCH: 31.4 pg (ref 26.0–34.0)
MCHC: 35.2 g/dL (ref 30.0–36.0)
MCV: 89.2 fL (ref 80.0–100.0)
Monocytes Absolute: 0.6 K/uL (ref 0.1–1.0)
Monocytes Relative: 7 %
Neutro Abs: 6.7 K/uL (ref 1.7–7.7)
Neutrophils Relative %: 84 %
Platelets: 202 K/uL (ref 150–400)
RBC: 4.46 MIL/uL (ref 4.22–5.81)
RDW: 13.5 % (ref 11.5–15.5)
WBC: 8 K/uL (ref 4.0–10.5)
nRBC: 0 % (ref 0.0–0.2)

## 2023-10-08 LAB — URINALYSIS, ROUTINE W REFLEX MICROSCOPIC
Glucose, UA: NEGATIVE mg/dL
Ketones, ur: 15 mg/dL — AB
Nitrite: POSITIVE — AB
Protein, ur: >=300 mg/dL — AB
Specific Gravity, Urine: >=1.03 (ref 1.005–1.030)
pH: 5.5 (ref 5.0–8.0)

## 2023-10-08 LAB — BASIC METABOLIC PANEL WITH GFR
Anion gap: 12 (ref 5–15)
BUN: 16 mg/dL (ref 6–20)
CO2: 24 mmol/L (ref 22–32)
Calcium: 8.9 mg/dL (ref 8.9–10.3)
Chloride: 103 mmol/L (ref 98–111)
Creatinine, Ser: 1.06 mg/dL (ref 0.61–1.24)
GFR, Estimated: >60 mL/min (ref >60)
Glucose, Bld: 109 mg/dL — ABNORMAL HIGH (ref 70–99)
Potassium: 4.1 mmol/L (ref 3.5–5.1)
Sodium: 139 mmol/L (ref 135–145)

## 2023-10-08 LAB — URINALYSIS, MICROSCOPIC (REFLEX)
RBC / HPF: >50 RBC/hpf (ref 0–5)
WBC, UA: >50 WBC/hpf (ref 0–5)

## 2023-10-08 MED ORDER — SODIUM CHLORIDE 0.9 % IV SOLN
1.0000 g | Freq: Once | INTRAVENOUS | Status: AC
  Administered 2023-10-08: 1 g via INTRAVENOUS
  Filled 2023-10-08: qty 10

## 2023-10-08 MED ORDER — LACTATED RINGERS IV BOLUS
1000.0000 mL | Freq: Once | INTRAVENOUS | Status: AC
  Administered 2023-10-08: 1000 mL via INTRAVENOUS

## 2023-10-08 MED ORDER — CEPHALEXIN 500 MG PO CAPS: 500.0000 mg | ORAL_CAPSULE | Freq: Two times a day (BID) | ORAL | 0 refills | Status: AC

## 2023-10-08 MED ORDER — IBUPROFEN 400 MG PO TABS
600.0000 mg | ORAL_TABLET | Freq: Once | ORAL | Status: AC
  Administered 2023-10-08: 600 mg via ORAL
  Filled 2023-10-08: qty 1

## 2023-10-08 NOTE — ED Triage Notes (Signed)
 Chills, fevers of 102 at home tonight , hematuria and dysuria that began today.   Says he has had UTI's in the past and waited too long.   Took tylenol  ~3 hours prior to arrival.

## 2023-10-08 NOTE — ED Provider Notes (Signed)
 [facility]  Provider Note  CSN: [id number] Arrival date & time: 10/08/23 0043  History Chief Complaint  Patient presents with   Fever    Jim Pearson is a 48 y.o. male with history of UTI reports onset of dysuria and hematuria that started earlier today and a fever onset tonight. He has had some chills,took some APAP prior to arrival. No other infection symptoms.    Home Medications Prior to Admission medications   Medication Sig Start Date End Date Taking? Authorizing Provider  cephALEXin  (KEFLEX ) 500 MG capsule Take 1 capsule (500 mg total) by mouth 2 (two) times daily for 7 days. 10/08/23 10/15/23 Yes Roselyn Carlin NOVAK, MD  clonazePAM (KLONOPIN) 2 MG tablet Take 2 mg by mouth 3 (three) times daily as needed for anxiety.    [provider]  lithium 600 MG capsule Take 600 mg by mouth 2 (two) times daily with a meal.    [provider]  oxyCODONE -acetaminophen  (PERCOCET) 5-325 MG tablet Take 2 tablets by mouth every 4 (four) hours as needed. 05/06/17   Mesner, Selinda, MD  PRAZOSIN HCL PO Take 4 mg by mouth at bedtime.    [provider]  QUEtiapine (SEROQUEL) 400 MG tablet Take 800 mg by mouth at bedtime.    [provider]  venlafaxine (EFFEXOR) 100 MG tablet Take 100 mg by mouth 3 (three) times daily.    [provider]  zolpidem (AMBIEN) 10 MG tablet Take 10 mg by mouth at bedtime as needed for sleep.    [provider]     Allergies    Tape   Review of Systems   Review of Systems Please see HPI for pertinent positives and negatives  Physical Exam BP (!) 145/75   Pulse 78   Temp (!) 100.5 F (38.1 C)   Resp 20   Ht 5' 10 (1.778 m)   Wt 111.1 kg   SpO2 97%   BMI 35.15 kg/m   Physical Exam Vitals and nursing note reviewed.  Constitutional:      Appearance: Normal appearance.  HENT:     Head: Normocephalic and atraumatic.     Nose: Nose normal.     Mouth/Throat:      Mouth: Mucous membranes are moist.  Eyes:     Extraocular Movements: Extraocular movements intact.     Conjunctiva/sclera: Conjunctivae normal.  Cardiovascular:     Rate and Rhythm: Normal rate.  Pulmonary:     Effort: Pulmonary effort is normal.     Breath sounds: Normal breath sounds.  Abdominal:     General: Abdomen is flat.     Palpations: Abdomen is soft.     Tenderness: There is no abdominal tenderness. There is no guarding.  Musculoskeletal:        General: No swelling. Normal range of motion.     Cervical back: Neck supple.  Skin:    General: Skin is warm and dry.  Neurological:     General: No focal deficit present.     Mental Status: He is alert.  Psychiatric:        Mood and Affect: Mood normal.     ED Results / Procedures / Treatments   EKG None  Procedures Procedures  Medications Ordered in the ED Medications  ibuprofen  (ADVIL ) tablet 600 mg (600 mg Oral Given 10/08/23 0131)  lactated ringers  bolus 1,000 mL (0 mLs Intravenous Stopped 10/08/23 0201)  cefTRIAXone  (ROCEPHIN ) 1 g in sodium  chloride 0.9 % 100 mL IVPB (1 g Intravenous New Bag/Given 10/08/23 0202)    Initial Impression and Plan  Patient with history of UTI, prior culture showed E. Coli here with dysuria and fever. He has normal HR and BP, exam is otherwise benign. Will check labs, give IVF and reassess.   ED Course   Clinical Course as of 10/08/23 0233  [facility] Oct 08, 2023  0144 UA is consistent with UTI. CBC with normal WBC. Will give a dose of Rocephin .  [CS]  0211 BMP is unremarkable.  [CS]  0230 Patient feeling better, plan discharge with Rx for Keflex . Recommend he follow up with Urology, PCP follow up, RTED for any other concerns.   [CS]    Clinical Course User Index [CS] Roselyn Carlin NOVAK, MD     MDM Rules/Calculators/A&P Medical Decision Making Given presenting complaint, I considered that admission might be necessary. After review of results from ED lab and/or imaging studies,  admission to the hospital is not indicated at this time.    Problems Addressed: Acute cystitis with hematuria: acute illness or injury  Amount and/or Complexity of Data Reviewed Labs: ordered. Decision-making details documented in ED Course.  Risk Prescription drug management. Decision regarding hospitalization.     Final Clinical Impression(s) / ED Diagnoses Final diagnoses:  Acute cystitis with hematuria    Rx / DC Orders ED Discharge Orders          Ordered    cephALEXin  (KEFLEX ) 500 MG capsule  2 times daily        10/08/23 0232             Roselyn Carlin NOVAK, MD 10/08/23 [phone]

## 2023-10-10 LAB — URINE CULTURE: Culture: >=100000 — AB

## 2023-10-11 ENCOUNTER — Telehealth (HOSPITAL_BASED_OUTPATIENT_CLINIC_OR_DEPARTMENT_OTHER): Payer: Self-pay

## 2023-10-11 NOTE — Telephone Encounter (Signed)
 Post ED Visit - Positive Culture Follow-up  Culture report reviewed by antimicrobial stewardship pharmacist: [facility] [x]  Aileen Jimenz, Pharm.D. []  Venetia Gully, Pharm.D., BCPS AQ-ID []  Garrel Crews, Pharm.D., BCPS []  Almarie Lunger, [address].D., BCPS []  Dillon, [address].D., BCPS, AAHIVP []  Rosaline Bihari, Pharm.D., BCPS, AAHIVP []  Vernell Meier, PharmD, BCPS []  Latanya Hint, PharmD, BCPS []  Donald Medley, PharmD, BCPS []  Rocky Bold, PharmD []  Dorothyann Alert, PharmD, BCPS []  Morene Babe, PharmD  [facility] []  Rosaline Edison, PharmD []  Romona Bliss, PharmD []  Dolphus Roller, PharmD []  Veva Seip, Rph []  Vernell Daunt) Leonce, PharmD []  Eva Allis, PharmD []  Rosaline Millet, PharmD []  Iantha Batch, PharmD []  Arvin Gauss, PharmD []  Wanda Hasting, PharmD []  Ronal Rav, PharmD []  Rocky Slade, PharmD []  Bard Jeans, PharmD   Positive urine culture Treated with Cephalexin , organism sensitive to the same and no further patient follow-up is required at this time.  Jim Pearson 10/11/2023, 8:37 AM

## 2023-10-24 ENCOUNTER — Other Ambulatory Visit: Payer: Self-pay

## 2023-10-24 ENCOUNTER — Encounter (HOSPITAL_BASED_OUTPATIENT_CLINIC_OR_DEPARTMENT_OTHER): Payer: Self-pay

## 2023-10-24 ENCOUNTER — Emergency Department (HOSPITAL_BASED_OUTPATIENT_CLINIC_OR_DEPARTMENT_OTHER)

## 2023-10-24 ENCOUNTER — Emergency Department (HOSPITAL_BASED_OUTPATIENT_CLINIC_OR_DEPARTMENT_OTHER)
Admission: EM | Admit: 2023-10-24 | Discharge: 2023-10-24 | Disposition: A | Discharge status: Home / Self Care | Attending: Emergency Medicine | Admitting: Emergency Medicine

## 2023-10-24 DIAGNOSIS — N390 Urinary tract infection, site not specified: Secondary | ICD-10-CM | POA: Diagnosis not present

## 2023-10-24 DIAGNOSIS — R319 Hematuria, unspecified: Secondary | ICD-10-CM | POA: Diagnosis present

## 2023-10-24 LAB — URINALYSIS, ROUTINE W REFLEX MICROSCOPIC
Glucose, UA: NEGATIVE mg/dL
Ketones, ur: NEGATIVE mg/dL
Nitrite: POSITIVE — AB
Protein, ur: >=300 mg/dL — AB
Specific Gravity, Urine: 1.025 (ref 1.005–1.030)
pH: 6 (ref 5.0–8.0)

## 2023-10-24 LAB — URINALYSIS, MICROSCOPIC (REFLEX): RBC / HPF: >50 RBC/hpf (ref 0–5)

## 2023-10-24 LAB — COMPREHENSIVE METABOLIC PANEL WITH GFR
ALT: 22 U/L (ref 0–44)
AST: 20 U/L (ref 15–41)
Albumin: 4.3 g/dL (ref 3.5–5.0)
Alkaline Phosphatase: 47 U/L (ref 38–126)
Anion gap: 11 (ref 5–15)
BUN: 12 mg/dL (ref 6–20)
CO2: 23 mmol/L (ref 22–32)
Calcium: 8.9 mg/dL (ref 8.9–10.3)
Chloride: 106 mmol/L (ref 98–111)
Creatinine, Ser: 0.92 mg/dL (ref 0.61–1.24)
GFR, Estimated: >60 mL/min (ref >60)
Glucose, Bld: 107 mg/dL — ABNORMAL HIGH (ref 70–99)
Potassium: 4.4 mmol/L (ref 3.5–5.1)
Sodium: 141 mmol/L (ref 135–145)
Total Bilirubin: 0.6 mg/dL (ref 0.0–1.2)
Total Protein: 6.6 g/dL (ref 6.5–8.1)

## 2023-10-24 LAB — CBC WITH DIFFERENTIAL/PLATELET
Abs Immature Granulocytes: 0.04 K/uL (ref 0.00–0.07)
Basophils Absolute: 0 K/uL (ref 0.0–0.1)
Basophils Relative: 0 %
Eosinophils Absolute: 0.1 K/uL (ref 0.0–0.5)
Eosinophils Relative: 1 %
HCT: 41.5 % (ref 39.0–52.0)
Hemoglobin: 13.7 g/dL (ref 13.0–17.0)
Immature Granulocytes: 1 %
Lymphocytes Relative: 12 %
Lymphs Abs: 1 K/uL (ref 0.7–4.0)
MCH: 29.7 pg (ref 26.0–34.0)
MCHC: 33 g/dL (ref 30.0–36.0)
MCV: 89.8 fL (ref 80.0–100.0)
Monocytes Absolute: 0.6 K/uL (ref 0.1–1.0)
Monocytes Relative: 8 %
Neutro Abs: 6 K/uL (ref 1.7–7.7)
Neutrophils Relative %: 78 %
Platelets: 231 K/uL (ref 150–400)
RBC: 4.62 MIL/uL (ref 4.22–5.81)
RDW: 13.5 % (ref 11.5–15.5)
WBC: 7.7 K/uL (ref 4.0–10.5)
nRBC: 0 % (ref 0.0–0.2)

## 2023-10-24 MED ORDER — SODIUM CHLORIDE 0.9 % IV SOLN
1.0000 g | Freq: Once | INTRAVENOUS | Status: AC
  Administered 2023-10-24: 1 g via INTRAVENOUS
  Filled 2023-10-24: qty 10

## 2023-10-24 MED ORDER — SODIUM CHLORIDE 0.9 % IV BOLUS
1000.0000 mL | Freq: Once | INTRAVENOUS | Status: AC
  Administered 2023-10-24: 1000 mL via INTRAVENOUS

## 2023-10-24 MED ORDER — CEFPODOXIME PROXETIL 200 MG PO TABS: 200.0000 mg | ORAL_TABLET | Freq: Two times a day (BID) | ORAL | 0 refills | Status: AC

## 2023-10-24 NOTE — ED Provider Notes (Signed)
 [place] [facility] Provider Note   CSN: [id number] Arrival date & time: 10/24/23  1044     Patient presents with: Hematuria   Jim Pearson is a 48 y.o. male.   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  Patient states that he had a UTI earlier in the month, was put on Keflex , and feels like he never fully got better.  He did have a few days after treatment where he seemed to have a resolution in his symptoms but now symptoms have come back over the last few days.  He is been having low-grade fevers around 101.  Now is starting to have recurrent dysuria.  Has messaged his urologist but has not heard back.  The patient states that all of the symptoms are similar to when he has prior UTIs including the fever and blood.  However he is also having some left lower abdominal pain that is not typical.  There is no back or flank pain.  No vomiting.  Prior to Admission medications   Medication Sig Start Date End Date Taking? Authorizing Provider  cefpodoxime  (VANTIN ) 200 MG tablet Take 1 tablet (200 mg total) by mouth 2 (two) times daily for 10 days. 10/24/23 11/03/23 Yes Freddi Hamilton, MD  clonazePAM (KLONOPIN) 2 MG tablet Take 2 mg by mouth 3 (three) times daily as needed for anxiety.    [provider]  lithium 600 MG capsule Take 600 mg by mouth 2 (two) times daily with a meal.    [provider]  oxyCODONE -acetaminophen  (PERCOCET) 5-325 MG tablet Take 2 tablets by mouth every 4 (four) hours as needed. 05/06/17   Mesner, Selinda, MD  PRAZOSIN HCL PO Take 4 mg by mouth at bedtime.    [provider]  QUEtiapine (SEROQUEL) 400 MG tablet Take 800 mg by mouth at bedtime.    [provider]  venlafaxine (EFFEXOR) 100 MG tablet Take 100 mg by mouth 3 (three) times daily.    [provider]  zolpidem (AMBIEN) 10 MG tablet Take 10 mg by mouth  at bedtime as needed for sleep.    [provider]    Allergies: Tape    Review of Systems  Constitutional:  Positive for fever.  Gastrointestinal:  Positive for abdominal pain. Negative for vomiting.  Genitourinary:  Positive for dysuria and hematuria. Negative for flank pain and testicular pain.  Musculoskeletal:  Negative for back pain.    Updated Vital Signs BP (!) 153/88   Pulse 60   Temp 98.4 F (36.9 C) (Oral)   Resp 17   Ht 5' 11 (1.803 m)   Wt 113.4 kg   SpO2 98%   BMI 34.87 kg/m   Physical Exam Vitals and nursing note reviewed.  Constitutional:      Appearance: He is well-developed.  HENT:     Head: Normocephalic and atraumatic.  Cardiovascular:     Rate and Rhythm: Normal rate and regular rhythm.     Heart sounds: Normal heart sounds.  Pulmonary:     Effort: Pulmonary effort is normal.     Breath sounds: Normal breath sounds.  Abdominal:     Palpations: Abdomen is soft.     Tenderness: There is abdominal tenderness (mild) in the left lower quadrant. There is no right CVA tenderness or left CVA tenderness.   Skin:    General: Skin is warm and dry.  Neurological:     Mental Status: He is alert.     (all labs ordered are listed, but only abnormal results are displayed) Labs Reviewed  URINALYSIS, ROUTINE W REFLEX MICROSCOPIC - Abnormal; Notable for the following components:      Result Value   Color, Urine BROWN (*)    APPearance CLOUDY (*)    Hgb urine dipstick LARGE (*)    Bilirubin Urine SMALL (*)    Protein, ur >=300 (*)    Nitrite POSITIVE (*)    Leukocytes,Ua SMALL (*)    All other components within normal limits  COMPREHENSIVE METABOLIC PANEL WITH GFR - Abnormal; Notable for the following components:   Glucose, Bld 107 (*)    All other components within normal limits  URINALYSIS, MICROSCOPIC (REFLEX) - Abnormal; Notable for the following components:   Bacteria, UA MANY (*)    All other components within normal limits  URINE  CULTURE  CBC WITH DIFFERENTIAL/PLATELET    EKG: None  Radiology: CT Renal Stone Study Result Date: 10/24/2023 CLINICAL DATA:  Chills, body ache, hematuria, dysuria and groin pain. EXAM: CT ABDOMEN AND PELVIS WITHOUT CONTRAST TECHNIQUE: Multidetector CT imaging of the abdomen and pelvis was performed following the standard protocol without IV contrast. RADIATION DOSE REDUCTION: This exam was performed according to the departmental dose-optimization program which includes automated exposure control, adjustment of the mA and/or kV according to patient size and/or use of iterative reconstruction technique. COMPARISON:  05/18/2012. FINDINGS: Lower chest: No acute findings. Heart is at the upper limits of normal in size. No pericardial or pleural effusion. Distal esophagus is grossly unremarkable. Hepatobiliary: Liver is grossly unremarkable. Cholecystectomy. No biliary ductal dilatation. Pancreas: Negative. Spleen: Negative. Adrenals/Urinary Tract: Adrenal glands and kidneys are unremarkable. Ureters are decompressed. Bladder is low in volume. Stomach/Bowel: Gastric bypass. Stomach, small bowel, appendix and colon are otherwise unremarkable. Vascular/Lymphatic: Atherosclerotic calcification of the aorta. No pathologically enlarged lymph nodes. Reproductive: Prostate is normal in size. Other: No free fluid.  Subxiphoid ventral hernia repair. Musculoskeletal: Degenerative changes in the spine. IMPRESSION: 1. No acute findings. 2.  Aortic atherosclerosis (ICD10-I70.0). Electronically Signed   By: Newell Eke M.D.   On: 10/24/2023 12:35     Procedures   Medications Ordered in the ED  sodium chloride  0.9 % bolus 1,000 mL (0 mLs Intravenous Stopped 10/24/23 1247)  cefTRIAXone  (ROCEPHIN ) 1 g in sodium chloride  0.9 % 100 mL IVPB (0 g Intravenous Stopped 10/24/23 1225)                                    Medical Decision Making Amount and/or Complexity of Data Reviewed Labs: ordered.    Details:  Unremarkable electrolytes and normal WBC.  Urine consistent with UTI. Radiology: ordered and independent interpretation performed.    Details: No ureteral stone  Risk Prescription drug management.   Patient presents with recurrent UTI.  Has had recurrent UTIs due to a penile issue and sees urology outside of the [facility].  CT was obtained as he does not typically have left lower abdominal pain but this is benign.  He does not appear septic.  He reports fevers at home but is afebrile here with a normal WBC.  He states fever always accompanies his UTIs and he does not appear ill or septic.  I doubt this is pyelonephritis.  He received Keflex  last time which should have treated his E. coli UTI according  to chart review.  However with his recurrent symptoms shortly after his original UTI, will increase him to Vantin .  He was also given a dose of IV Rocephin .  No ureteral stone or obvious abnormality on the CT.  Will have him follow-up with urology but he appears stable for discharge home with return precautions.     Final diagnoses:  Complicated UTI (urinary tract infection)    ED Discharge Orders          Ordered    cefpodoxime  (VANTIN ) 200 MG tablet  2 times daily        10/24/23 1250               Freddi Hamilton, MD 10/24/23 1302

## 2023-10-24 NOTE — ED Triage Notes (Signed)
 Pt states that they had a pretty bad UTI a couple weeks ago, and similar symptoms have started again approx 4 days ago. Pt c.o chills, body ache, hematuria, dysuria and groin pain.

## 2023-10-24 NOTE — Discharge Instructions (Addendum)
 We are treating you with 10 days of antibiotics.  You can start this tomorrow as you were given an IV dose of antibiotics today.  Call your urologist today or tomorrow to set up outpatient follow-up later this week or early next week.  If your fevers do not go away, you develop new or worsening pain, vomiting, or any other new/concerning symptoms then return to the ER.

## 2023-10-26 LAB — URINE CULTURE: Culture: >=100000 — AB

## 2023-10-27 ENCOUNTER — Telehealth (HOSPITAL_BASED_OUTPATIENT_CLINIC_OR_DEPARTMENT_OTHER): Payer: Self-pay

## 2023-10-27 NOTE — Telephone Encounter (Signed)
 Post ED Visit - Positive Culture Follow-up  Culture report reviewed by antimicrobial stewardship pharmacist: [facility] [x]  Dorn Buttner, Pharm.D. []  Venetia Gully, Pharm.D., BCPS AQ-ID []  Garrel Crews, Pharm.D., BCPS []  Almarie Lunger, Pharm.D., BCPS []  [place], [place].D., BCPS, AAHIVP []  Rosaline Bihari, Pharm.D., BCPS, AAHIVP []  Vernell Meier, PharmD, BCPS []  Latanya Hint, PharmD, BCPS []  Donald Medley, PharmD, BCPS []  Rocky Bold, PharmD []  Dorothyann Alert, PharmD, BCPS []  Morene Babe, PharmD  [facility] []  Rosaline Edison, PharmD []  Romona Bliss, PharmD []  Dolphus Roller, PharmD []  Veva Seip, Rph []  Vernell Daunt) Leonce, PharmD []  Eva Allis, PharmD []  Rosaline Millet, PharmD []  Iantha Batch, PharmD []  Arvin Gauss, PharmD []  Wanda Hasting, PharmD []  Ronal Rav, PharmD []  Rocky Slade, PharmD []  Bard Jeans, PharmD   Positive urine culture Treated with Cefpodoxime , organism sensitive to the same and no further patient follow-up is required at this time.  Ruth Camelia Elbe 10/27/2023, 3:47 PM

## 2023-10-31 ENCOUNTER — Other Ambulatory Visit: Payer: Self-pay | Admitting: Medical Genetics

## 2023-11-11 ENCOUNTER — Encounter (HOSPITAL_BASED_OUTPATIENT_CLINIC_OR_DEPARTMENT_OTHER): Payer: Self-pay

## 2023-11-11 ENCOUNTER — Other Ambulatory Visit: Payer: Self-pay

## 2023-11-11 ENCOUNTER — Emergency Department (HOSPITAL_BASED_OUTPATIENT_CLINIC_OR_DEPARTMENT_OTHER)
Admission: EM | Admit: 2023-11-11 | Discharge: 2023-11-11 | Disposition: A | Discharge status: Home / Self Care | Attending: Emergency Medicine | Admitting: Emergency Medicine

## 2023-11-11 ENCOUNTER — Emergency Department (HOSPITAL_BASED_OUTPATIENT_CLINIC_OR_DEPARTMENT_OTHER)

## 2023-11-11 DIAGNOSIS — N3001 Acute cystitis with hematuria: Secondary | ICD-10-CM | POA: Insufficient documentation

## 2023-11-11 DIAGNOSIS — R319 Hematuria, unspecified: Secondary | ICD-10-CM | POA: Diagnosis present

## 2023-11-11 DIAGNOSIS — I10 Essential (primary) hypertension: Secondary | ICD-10-CM | POA: Insufficient documentation

## 2023-11-11 LAB — LACTIC ACID, PLASMA: Lactic Acid, Venous: 1 mmol/L (ref 0.5–1.9)

## 2023-11-11 LAB — CBC WITH DIFFERENTIAL/PLATELET
Abs Immature Granulocytes: 0.02 K/uL (ref 0.00–0.07)
Basophils Absolute: 0 K/uL (ref 0.0–0.1)
Basophils Relative: 0 %
Eosinophils Absolute: 0.2 K/uL (ref 0.0–0.5)
Eosinophils Relative: 3 %
HCT: 38 % — ABNORMAL LOW (ref 39.0–52.0)
Hemoglobin: 13.1 g/dL (ref 13.0–17.0)
Immature Granulocytes: 0 %
Lymphocytes Relative: 21 %
Lymphs Abs: 1.2 K/uL (ref 0.7–4.0)
MCH: 30.3 pg (ref 26.0–34.0)
MCHC: 34.5 g/dL (ref 30.0–36.0)
MCV: 88 fL (ref 80.0–100.0)
Monocytes Absolute: 0.4 K/uL (ref 0.1–1.0)
Monocytes Relative: 8 %
Neutro Abs: 3.9 K/uL (ref 1.7–7.7)
Neutrophils Relative %: 68 %
Platelets: 191 K/uL (ref 150–400)
RBC: 4.32 MIL/uL (ref 4.22–5.81)
RDW: 13.6 % (ref 11.5–15.5)
WBC: 5.7 K/uL (ref 4.0–10.5)
nRBC: 0 % (ref 0.0–0.2)

## 2023-11-11 LAB — URINALYSIS, MICROSCOPIC (REFLEX)

## 2023-11-11 LAB — URINALYSIS, ROUTINE W REFLEX MICROSCOPIC
Bilirubin Urine: NEGATIVE
Glucose, UA: NEGATIVE mg/dL
Ketones, ur: NEGATIVE mg/dL
Nitrite: NEGATIVE
Protein, ur: >=300 mg/dL — AB
Specific Gravity, Urine: >=1.03 (ref 1.005–1.030)
pH: 5.5 (ref 5.0–8.0)

## 2023-11-11 LAB — BASIC METABOLIC PANEL WITH GFR
Anion gap: 11 (ref 5–15)
BUN: 10 mg/dL (ref 6–20)
CO2: 22 mmol/L (ref 22–32)
Calcium: 8.5 mg/dL — ABNORMAL LOW (ref 8.9–10.3)
Chloride: 105 mmol/L (ref 98–111)
Creatinine, Ser: 0.9 mg/dL (ref 0.61–1.24)
GFR, Estimated: >60 mL/min (ref >60)
Glucose, Bld: 100 mg/dL — ABNORMAL HIGH (ref 70–99)
Potassium: 4.5 mmol/L (ref 3.5–5.1)
Sodium: 139 mmol/L (ref 135–145)

## 2023-11-11 MED ORDER — CEFTRIAXONE SODIUM 1 G IJ SOLR
1.0000 g | Freq: Once | INTRAMUSCULAR | Status: AC
  Administered 2023-11-11: 1 g via INTRAVENOUS
  Filled 2023-11-11: qty 10

## 2023-11-11 MED ORDER — CEFPODOXIME PROXETIL 200 MG PO TABS: 200.0000 mg | ORAL_TABLET | Freq: Two times a day (BID) | ORAL | 0 refills | Status: AC

## 2023-11-11 MED ORDER — FENTANYL CITRATE (PF) 50 MCG/ML IJ SOSY
50.0000 ug | PREFILLED_SYRINGE | Freq: Once | INTRAMUSCULAR | Status: AC
  Administered 2023-11-11: 50 ug via INTRAVENOUS
  Filled 2023-11-11: qty 1

## 2023-11-11 NOTE — ED Provider Notes (Signed)
 [place] [facility] Provider Note   CSN: [id number] Arrival date & time: 11/11/23  1132     Patient presents with: Dysuria and Testicle Pain   Jim Pearson is a 48 y.o. male.   Patient is a 48 year old male who presents with painful urination/hematuria and fever.  He has a history of a buried penis and has had recurrent UTIs.  He was seen twice in September for UTIs.  His urine cultures on those visits grew out E. coli that had a fairly broad sensitivity.  He said that he returns today with a 2-day history of pain on urination associated with hematuria and fevers up to 101.2 last night.  He is having chills today but no fever today.  No nausea or vomiting.  He is having some pain in his scrotal area which she does not typically have with his UTIs.  He is being followed by urology with [facility].  He did have a recent CT urogram on October 6 of this year due to his hematuria.  This was negative for acute abnormality.  No kidney stones were noted.  No masses were noted.  He has an appointment with plastic surgery later this month for repair of his buried penis.  It was felt that this is the cause of his recurrent UTIs.       Prior to Admission medications   Medication Sig Start Date End Date Taking? Authorizing Provider  cefpodoxime  (VANTIN ) 200 MG tablet Take 1 tablet (200 mg total) by mouth 2 (two) times daily. 11/11/23  Yes Lenor Hollering, MD  clonazePAM (KLONOPIN) 2 MG tablet Take 2 mg by mouth 3 (three) times daily as needed for anxiety.    [provider]  lithium 600 MG capsule Take 600 mg by mouth 2 (two) times daily with a meal.    [provider]  oxyCODONE -acetaminophen  (PERCOCET) 5-325 MG tablet Take 2 tablets by mouth every 4 (four) hours as needed. 05/06/17   Mesner, Selinda, MD  PRAZOSIN HCL PO Take 4 mg by mouth at bedtime.    [provider]  QUEtiapine (SEROQUEL) 400 MG tablet Take 800 mg by mouth at bedtime.     [provider]  venlafaxine (EFFEXOR) 100 MG tablet Take 100 mg by mouth 3 (three) times daily.    [provider]  zolpidem (AMBIEN) 10 MG tablet Take 10 mg by mouth at bedtime as needed for sleep.    [provider]    Allergies: Patient has no known allergies.    Review of Systems  Constitutional:  Positive for chills, fatigue and fever. Negative for diaphoresis.  HENT:  Negative for congestion, rhinorrhea and sneezing.   Eyes: Negative.   Respiratory:  Negative for cough, chest tightness and shortness of breath.   Cardiovascular:  Negative for chest pain and leg swelling.  Gastrointestinal:  Negative for abdominal pain, diarrhea, nausea and vomiting.  Genitourinary:  Positive for dysuria, frequency, hematuria and testicular pain. Negative for difficulty urinating, flank pain and penile discharge.  Musculoskeletal:  Negative for back pain.  Skin:  Negative for rash.  Neurological:  Negative for dizziness, weakness, numbness and headaches.    Updated Vital Signs BP (!) 133/94   Pulse (!) 58   Temp 98.2 F (36.8 C) (Oral)   Resp 18   Ht 5' 11 (1.803 m)   Wt 108.9 kg   SpO2 95%   BMI 33.47 kg/m   Physical Exam Constitutional:  Appearance: He is well-developed.  HENT:     Head: Normocephalic and atraumatic.  Eyes:     Pupils: Pupils are equal, round, and reactive to light.  Cardiovascular:     Rate and Rhythm: Normal rate and regular rhythm.     Heart sounds: Normal heart sounds.  Pulmonary:     Effort: Pulmonary effort is normal. No respiratory distress.     Breath sounds: Normal breath sounds. No wheezing or rales.  Chest:     Chest wall: No tenderness.  Abdominal:     General: Bowel sounds are normal.     Palpations: Abdomen is soft.     Tenderness: There is no abdominal tenderness. There is no guarding or rebound.  Genitourinary:    Comments: Patient has a buried penis.  There is pain generally in the scrotal sac.  No specific  area of tenderness.  No erythema to the area.  No abscesses noted. Musculoskeletal:        General: Normal range of motion.     Cervical back: Normal range of motion and neck supple.  Lymphadenopathy:     Cervical: No cervical adenopathy.  Skin:    General: Skin is warm and dry.     Findings: No rash.  Neurological:     Mental Status: He is alert and oriented to person, place, and time.     (all labs ordered are listed, but only abnormal results are displayed) Labs Reviewed  URINALYSIS, ROUTINE W REFLEX MICROSCOPIC - Abnormal; Notable for the following components:      Result Value   APPearance HAZY (*)    Hgb urine dipstick MODERATE (*)    Protein, ur >=300 (*)    Leukocytes,Ua SMALL (*)    All other components within normal limits  URINALYSIS, MICROSCOPIC (REFLEX) - Abnormal; Notable for the following components:   Bacteria, UA FEW (*)    All other components within normal limits  CBC WITH DIFFERENTIAL/PLATELET - Abnormal; Notable for the following components:   HCT 38.0 (*)    All other components within normal limits  BASIC METABOLIC PANEL WITH GFR - Abnormal; Notable for the following components:   Glucose, Bld 100 (*)    Calcium 8.5 (*)    All other components within normal limits  URINE CULTURE  LACTIC ACID, PLASMA    EKG: None  Radiology: US  SCROTUM W/DOPPLER Result Date: 11/11/2023 CLINICAL DATA:  Scrotal pain. EXAM: SCROTAL ULTRASOUND DOPPLER ULTRASOUND OF THE TESTICLES TECHNIQUE: Complete ultrasound examination of the testicles, epididymis, and other scrotal structures was performed. Color and spectral Doppler ultrasound were also utilized to evaluate blood flow to the testicles. COMPARISON:  None Available. FINDINGS: Right testicle Measurements: 3.8 cm x 2.0 cm x 2.5 cm. No mass or microlithiasis visualized. Left testicle Measurements: 3.3 cm x 2.0 cm x 2.6 cm. No mass or microlithiasis visualized. Right epididymis:  Normal in size and appearance. Left  epididymis:  Normal in size and appearance. Hydrocele:  None visualized. Varicocele:  None visualized. Pulsed Doppler interrogation of both testes demonstrates normal low resistance arterial and venous waveforms bilaterally. Other: The study is technically difficult secondary to the patient's body habitus, as per the ultrasound technologist. IMPRESSION: Unremarkable scrotal ultrasound. Electronically Signed   By: Suzen Dials M.D.   On: 11/11/2023 13:50     Procedures   Medications Ordered in the ED  cefTRIAXone  (ROCEPHIN ) 1 g in sodium chloride  0.9 % 100 mL IVPB (0 g Intravenous Stopped 11/11/23 1313)  fentaNYL (SUBLIMAZE) injection 50  mcg (50 mcg Intravenous Given 11/11/23 1237)                                    Medical Decision Making Amount and/or Complexity of Data Reviewed Labs: ordered. Radiology: ordered.  Risk Prescription drug management.   This patient presents to the ED for concern of dysuria, fever, hematuria, this involves an extensive number of treatment options, and is a complaint that carries with it a high risk of complications and morbidity.  I considered the following differential and admission for this acute, potentially life threatening condition.  The differential diagnosis includes UTI, pyelonephritis, kidney stone, epididymitis, orchitis, scrotal abscess, STD  MDM:    Patient is a 48 year old who presents with painful urination, hematuria and fever.  He has had recurrent UTIs related to a buried penis although he is had 2 UTIs in September which is unusual for him.  He comes in today with similar symptoms.  He does have some pain in his scrotum.  On my initial exam, I was not able to palpate any discrete testicles and actually felt some lumps more in the inguinal canals which were concerning for possible retracted testicles.  The ultrasound tech performed the ultrasound and noted the testicles to be up in the inguinal canals bilaterally.  There appears to be  good blood flow.  No evidence of torsion.  No evidence of epididymitis.  When I went to reexamine the patient, he no longer had any tenderness in his scrotal sac and I reexamined his testicles.  I am able to palpate what I believed to be his testicles down in his scrotal sac.  I reviewed the prior urology notes and I do not see any documentation of retracted testicles or abnormal scrotum exam in the past.  His labs are reassuring.  No concerns for sepsis clinically.  His urine is concerning for infection.  Will send for culture.  He was given dose of IV Rocephin .  Will start him on oral antibiotics.  His prior cultures were E. coli that had a broad sensitivity.  Will start him back on cefpodoxime .  I do not feel that he needs a CT scan.  He had a recent scan within the last week that did not show any stones in his kidney or other acute abnormality.  Encouraged him to have close follow-up with his urologist.  He will call tomorrow for an appointment.  He was discharged home in good condition.  Strict return precautions were given.  (Labs, imaging, consults)  Labs: I Ordered, and personally interpreted labs.  The pertinent results include: WBC count normal, lactic acid normal, urine concerning for infection  Imaging Studies ordered: I ordered imaging studies including none I independently visualized and interpreted imaging. I agree with the radiologist interpretation  Additional history obtained from chart.  External records from outside source obtained and reviewed including prior notes  Cardiac Monitoring: The patient was maintained on a cardiac monitor.  If on the cardiac monitor, I personally viewed and interpreted the cardiac monitored which showed an underlying rhythm of: Sinus tachycardia  Reevaluation: After the interventions noted above, I reevaluated the patient and found that they have :improved  Social Determinants of Health:    Disposition: Discharged to home  Co morbidities that  complicate the patient evaluation  Past Medical History:  Diagnosis Date   Anxiety    Bipolar 1 disorder (HCC)    Depression  Hypertension    Sleep apnea      Medicines Meds ordered this encounter  Medications   cefTRIAXone  (ROCEPHIN ) 1 g in sodium chloride  0.9 % 100 mL IVPB    Antibiotic Indication::   UTI   fentaNYL (SUBLIMAZE) injection 50 mcg   cefpodoxime  (VANTIN ) 200 MG tablet    Sig: Take 1 tablet (200 mg total) by mouth 2 (two) times daily.    Dispense:  14 tablet    Refill:  0    I have reviewed the patients home medicines and have made adjustments as needed  Problem List / ED Course: Problem List Items Addressed This Visit   None Visit Diagnoses       Acute cystitis with hematuria    -  Primary                Final diagnoses:  Acute cystitis with hematuria    ED Discharge Orders          Ordered    cefpodoxime  (VANTIN ) 200 MG tablet  2 times daily        11/11/23 1410               Lenor Hollering, MD 11/11/23 1416

## 2023-11-11 NOTE — ED Notes (Signed)
 Patient transported to Ultrasound

## 2023-11-11 NOTE — Discharge Instructions (Addendum)
 Call your urologist tomorrow to have close follow-up within the next few days.  Return to the emergency room if you have any worsening symptoms.

## 2023-11-11 NOTE — ED Triage Notes (Signed)
 C/o hematuria, fever, chills, dysuria, frequent urination since yesterday. Took at home test, +UTI. Hx of UTI with similar symptoms.  Dx with UTI at the end of September. Started having testicle pain during UTI episode, has worsened since. Spoke with provider yesterday that said to get US  of testicles.

## 2023-11-13 LAB — URINE CULTURE: Culture: >=100000 — AB

## 2023-11-14 ENCOUNTER — Other Ambulatory Visit: Payer: Self-pay

## 2023-11-14 ENCOUNTER — Emergency Department (HOSPITAL_BASED_OUTPATIENT_CLINIC_OR_DEPARTMENT_OTHER)

## 2023-11-14 ENCOUNTER — Encounter (HOSPITAL_BASED_OUTPATIENT_CLINIC_OR_DEPARTMENT_OTHER): Payer: Self-pay | Admitting: Emergency Medicine

## 2023-11-14 ENCOUNTER — Telehealth (HOSPITAL_BASED_OUTPATIENT_CLINIC_OR_DEPARTMENT_OTHER): Payer: Self-pay | Admitting: Emergency Medicine

## 2023-11-14 ENCOUNTER — Emergency Department (HOSPITAL_BASED_OUTPATIENT_CLINIC_OR_DEPARTMENT_OTHER)
Admission: EM | Admit: 2023-11-14 | Discharge: 2023-11-14 | Disposition: A | Discharge status: Home / Self Care | Attending: Emergency Medicine | Admitting: Emergency Medicine

## 2023-11-14 DIAGNOSIS — R1032 Left lower quadrant pain: Secondary | ICD-10-CM | POA: Insufficient documentation

## 2023-11-14 DIAGNOSIS — R300 Dysuria: Secondary | ICD-10-CM | POA: Insufficient documentation

## 2023-11-14 DIAGNOSIS — R109 Unspecified abdominal pain: Secondary | ICD-10-CM

## 2023-11-14 DIAGNOSIS — I10 Essential (primary) hypertension: Secondary | ICD-10-CM | POA: Diagnosis not present

## 2023-11-14 LAB — CBC WITH DIFFERENTIAL/PLATELET
Abs Immature Granulocytes: 0.01 10*3/uL (ref 0.00–0.07)
Basophils Absolute: 0 10*3/uL (ref 0.0–0.1)
Basophils Relative: 0 %
Eosinophils Absolute: 0.1 10*3/uL (ref 0.0–0.5)
Eosinophils Relative: 3 %
HCT: 41.6 % (ref 39.0–52.0)
Hemoglobin: 13.9 g/dL (ref 13.0–17.0)
Immature Granulocytes: 0 %
Lymphocytes Relative: 23 %
Lymphs Abs: 1.1 10*3/uL (ref 0.7–4.0)
MCH: 29.6 pg (ref 26.0–34.0)
MCHC: 33.4 g/dL (ref 30.0–36.0)
MCV: 88.5 fL (ref 80.0–100.0)
Monocytes Absolute: 0.4 10*3/uL (ref 0.1–1.0)
Monocytes Relative: 8 %
Neutro Abs: 3.3 10*3/uL (ref 1.7–7.7)
Neutrophils Relative %: 66 %
Platelets: 228 10*3/uL (ref 150–400)
RBC: 4.7 MIL/uL (ref 4.22–5.81)
RDW: 14 % (ref 11.5–15.5)
WBC: 5 10*3/uL (ref 4.0–10.5)
nRBC: 0 % (ref 0.0–0.2)

## 2023-11-14 LAB — LIPASE, BLOOD: Lipase: 47 U/L (ref 11–51)

## 2023-11-14 LAB — URINALYSIS, ROUTINE W REFLEX MICROSCOPIC
Bilirubin Urine: NEGATIVE
Glucose, UA: NEGATIVE mg/dL
Hgb urine dipstick: NEGATIVE
Ketones, ur: NEGATIVE mg/dL
Leukocytes,Ua: NEGATIVE
Nitrite: NEGATIVE
Protein, ur: NEGATIVE mg/dL
Specific Gravity, Urine: 1.02 (ref 1.005–1.030)
pH: 6.5 (ref 5.0–8.0)

## 2023-11-14 LAB — COMPREHENSIVE METABOLIC PANEL WITH GFR
ALT: 24 U/L (ref 0–44)
AST: 25 U/L (ref 15–41)
Albumin: 4.3 g/dL (ref 3.5–5.0)
Alkaline Phosphatase: 47 U/L (ref 38–126)
Anion gap: 11 (ref 5–15)
BUN: 15 mg/dL (ref 6–20)
CO2: 24 mmol/L (ref 22–32)
Calcium: 9 mg/dL (ref 8.9–10.3)
Chloride: 103 mmol/L (ref 98–111)
Creatinine, Ser: 0.91 mg/dL (ref 0.61–1.24)
GFR, Estimated: >60 mL/min
Glucose, Bld: 96 mg/dL (ref 70–99)
Potassium: 4.3 mmol/L (ref 3.5–5.1)
Sodium: 138 mmol/L (ref 135–145)
Total Bilirubin: 0.6 mg/dL (ref 0.0–1.2)
Total Protein: 6.4 g/dL — ABNORMAL LOW (ref 6.5–8.1)

## 2023-11-14 MED ORDER — MORPHINE SULFATE (PF) 4 MG/ML IV SOLN
4.0000 mg | Freq: Once | INTRAVENOUS | Status: AC
  Administered 2023-11-14: 4 mg via INTRAVENOUS
  Filled 2023-11-14: qty 1

## 2023-11-14 MED ORDER — ONDANSETRON HCL 4 MG/2ML IJ SOLN
4.0000 mg | Freq: Once | INTRAMUSCULAR | Status: AC
  Administered 2023-11-14: 4 mg via INTRAVENOUS
  Filled 2023-11-14: qty 2

## 2023-11-14 MED ORDER — KETOROLAC TROMETHAMINE 15 MG/ML IJ SOLN
15.0000 mg | Freq: Once | INTRAMUSCULAR | Status: AC
  Administered 2023-11-14: 15 mg via INTRAVENOUS
  Filled 2023-11-14: qty 1

## 2023-11-14 NOTE — ED Notes (Signed)
 Offered pt to go to the bathroom so I can get an urine sample but pt refused. States he is in a lot of pain right now.

## 2023-11-14 NOTE — ED Triage Notes (Signed)
 Recurrent and worsening left groin , was seen and treated for UTI 3 days ago .  Recurrent Dysuria

## 2023-11-14 NOTE — Discharge Instructions (Signed)
 Evaluation today was reassuring.  Please follow-up with your PCP.  Continue taking the Vantin  as prescribed to you but it appears that your UTI is resolving based on your urinalysis here.  If your symptoms worsen or return or you develop a fever or any other concern please return to the ED for further evaluation.

## 2023-11-14 NOTE — ED Notes (Signed)
 Patient transported to CT

## 2023-11-14 NOTE — ED Provider Notes (Signed)
 [facility] Provider Note   CSN: [id number] Arrival date & time: 11/14/23  1519     Patient presents with: Groin Pain  HPI Jim Pearson is a 48 y.o. male with recurrent UTIs, bipolar 1, hypertension presenting for dysuria and left groin pain.  Symptoms have been going on since he was diagnosed with a UTI 3 days ago.  He has been taking the Vantin  as was prescribed to him.  States the left groin pain is been intermittent but worse in the last few days but he has has this pain before with UTIs.  Denies any blood in the urine.  Endorses some nausea and diarrhea but no vomiting.    Groin Pain       Prior to Admission medications   Medication Sig Start Date End Date Taking? Authorizing Provider  cefpodoxime  (VANTIN ) 200 MG tablet Take 1 tablet (200 mg total) by mouth 2 (two) times daily. 11/11/23   Lenor Hollering, MD  clonazePAM (KLONOPIN) 2 MG tablet Take 2 mg by mouth 3 (three) times daily as needed for anxiety.    [provider]  lithium 600 MG capsule Take 600 mg by mouth 2 (two) times daily with a meal.    [provider]  oxyCODONE -acetaminophen  (PERCOCET) 5-325 MG tablet Take 2 tablets by mouth every 4 (four) hours as needed. 05/06/17   Mesner, Selinda, MD  PRAZOSIN HCL PO Take 4 mg by mouth at bedtime.    [provider]  QUEtiapine (SEROQUEL) 400 MG tablet Take 800 mg by mouth at bedtime.    [provider]  venlafaxine (EFFEXOR) 100 MG tablet Take 100 mg by mouth 3 (three) times daily.    [provider]  zolpidem (AMBIEN) 10 MG tablet Take 10 mg by mouth at bedtime as needed for sleep.    [provider]    Allergies: Patient has no known allergies.    Review of Systems  Updated Vital Signs BP (!) 169/110 (BP Location: Right Arm)   Pulse 65   Temp 98 F (36.7 C) (Oral)   Resp 20   SpO2 98%   Physical Exam Vitals and nursing note reviewed.  HENT:     Head:  Normocephalic and atraumatic.     Mouth/Throat:     Mouth: Mucous membranes are moist.  Eyes:     General:        Right eye: No discharge.        Left eye: No discharge.     Conjunctiva/sclera: Conjunctivae normal.  Cardiovascular:     Rate and Rhythm: Normal rate and regular rhythm.     Pulses: Normal pulses.     Heart sounds: Normal heart sounds.  Pulmonary:     Effort: Pulmonary effort is normal.     Breath sounds: Normal breath sounds.  Abdominal:     General: Abdomen is flat.     Palpations: Abdomen is soft.     Tenderness: There is abdominal tenderness in the left lower quadrant.  Skin:    General: Skin is warm and dry.  Neurological:     General: No focal deficit present.  Psychiatric:        Mood and Affect: Mood normal.     (all labs ordered are listed, but only abnormal results are displayed) Labs Reviewed  COMPREHENSIVE METABOLIC PANEL WITH GFR - Abnormal; Notable for the following components:      Result Value   Total Protein 6.4 (*)  All other components within normal limits  CBC WITH DIFFERENTIAL/PLATELET  LIPASE, BLOOD  URINALYSIS, ROUTINE W REFLEX MICROSCOPIC    EKG: None  Radiology: CT Renal Stone Study Result Date: 11/14/2023 CLINICAL DATA:  Left flank/groin pain EXAM: CT ABDOMEN AND PELVIS WITHOUT CONTRAST TECHNIQUE: Multidetector CT imaging of the abdomen and pelvis was performed following the standard protocol without IV contrast. RADIATION DOSE REDUCTION: This exam was performed according to the departmental dose-optimization program which includes automated exposure control, adjustment of the mA and/or kV according to patient size and/or use of iterative reconstruction technique. COMPARISON:  10/24/2023 FINDINGS: Lower chest: No acute findings Hepatobiliary: No focal liver abnormality is seen. Status post cholecystectomy. No biliary dilatation. Pancreas: No focal abnormality or ductal dilatation. Spleen: No focal abnormality.  Normal size.  Adrenals/Urinary Tract: No adrenal abnormality. No focal renal abnormality. No stones or hydronephrosis. Urinary bladder is unremarkable. Stomach/Bowel: Normal appendix. Prior gastric bypass. No bowel obstruction or inflammatory process. Vascular/Lymphatic: No evidence of aneurysm or adenopathy. Scattered aortic atherosclerosis. Reproductive: No visible focal abnormality. Other: No free fluid or free air. Musculoskeletal: No acute bony abnormality. IMPRESSION: No acute findings in the abdomen or pelvis. Status post gastric bypass.  No complicating feature. No renal or ureteral stones.  No hydronephrosis. Electronically Signed   By: Franky Crease M.D.   On: 11/14/2023 18:14     Procedures   Medications Ordered in the ED  ondansetron  (ZOFRAN ) injection 4 mg (4 mg Intravenous Given 11/14/23 1750)  morphine (PF) 4 MG/ML injection 4 mg (4 mg Intravenous Given 11/14/23 1751)  ketorolac (TORADOL) 15 MG/ML injection 15 mg (15 mg Intravenous Given 11/14/23 1905)  ondansetron  (ZOFRAN ) injection 4 mg (4 mg Intravenous Given 11/14/23 1904)                                    Medical Decision Making Amount and/or Complexity of Data Reviewed Labs: ordered. Radiology: ordered.  Risk Prescription drug management.   48 year old well-appearing male presenting for dysuria and lower abdominal pain.  Exam notable for left lower quadrant tenderness.  DDx includes UTI, kidney stone, diverticulitis, hernia, other.  Overall he looks well and nontoxic.  Vitals are reassuring and he is afebrile.  Labs are unremarkable.  Urinalysis is normal.  No acute findings on CT.  Advised him to continue taking his Vantin  as prescribed but it appears that his UTI is resolving.  Discussed return precautions but advised PCP follow-up.  Advised Tylenol  and ibuprofen  for his recurrent abdominal pain.  Discharged in good condition.     Final diagnoses:  Dysuria  Abdominal pain, unspecified abdominal location    ED Discharge  Orders     None          Lang Norleen POUR, PA-C 11/14/23 2058    Pamella Ozell LABOR, DO 11/16/23 0025

## 2023-11-14 NOTE — Telephone Encounter (Signed)
 Post ED Visit - Positive Culture Follow-up  Culture report reviewed by antimicrobial stewardship pharmacist: [facility] []  Rankin Dee, Pharm.D. []  Venetia Gully, Pharm.D., BCPS AQ-ID []  Garrel Crews, Pharm.D., BCPS []  Almarie Lunger, Pharm.D., BCPS []  [place], [place].D., BCPS, AAHIVP []  Rosaline Bihari, Pharm.D., BCPS, AAHIVP []  Vernell Meier, PharmD, BCPS []  Latanya Hint, PharmD, BCPS []  Donald Medley, PharmD, BCPS [x]  Amon Rocher, PharmD []  Dorothyann Alert, PharmD, BCPS []  Morene Babe, PharmD  [facility] []  Rosaline Edison, PharmD []  Romona Bliss, PharmD []  Dolphus Roller, PharmD []  Veva Seip, Rph []  Vernell Daunt) Leonce, PharmD []  Eva Allis, PharmD []  Rosaline Millet, PharmD []  Iantha Batch, PharmD []  Arvin Gauss, PharmD []  Wanda Hasting, PharmD []  Ronal Rav, PharmD []  Rocky Slade, PharmD []  Bard Jeans, PharmD   Positive urine culture Treated with Cefpodoxime , organism sensitive to the same and no further patient follow-up is required at this time.  Jim Pearson Jim Pearson 11/14/2023, 4:32 PM
